# Patient Record
Sex: Male | Born: 1955 | Race: Black or African American | Hispanic: No | Marital: Married | Smoking: Never smoker
Health system: Southern US, Community
[De-identification: ages and names within clinical notes are randomized; demographics above are authoritative.]

## PROBLEM LIST (undated history)

## (undated) DIAGNOSIS — I10 Essential (primary) hypertension: Secondary | ICD-10-CM

## (undated) DIAGNOSIS — R002 Palpitations: Secondary | ICD-10-CM

## (undated) DIAGNOSIS — D1803 Hemangioma of intra-abdominal structures: Secondary | ICD-10-CM

## (undated) DIAGNOSIS — L309 Dermatitis, unspecified: Secondary | ICD-10-CM

## (undated) DIAGNOSIS — B1920 Unspecified viral hepatitis C without hepatic coma: Secondary | ICD-10-CM

## (undated) DIAGNOSIS — K5790 Diverticulosis of intestine, part unspecified, without perforation or abscess without bleeding: Secondary | ICD-10-CM

## (undated) DIAGNOSIS — N400 Enlarged prostate without lower urinary tract symptoms: Secondary | ICD-10-CM

## (undated) DIAGNOSIS — F419 Anxiety disorder, unspecified: Secondary | ICD-10-CM

## (undated) DIAGNOSIS — C61 Malignant neoplasm of prostate: Secondary | ICD-10-CM

## (undated) HISTORY — PX: ROTATOR CUFF REPAIR: SHX139

## (undated) HISTORY — DX: Unspecified viral hepatitis C without hepatic coma: B19.20

## (undated) HISTORY — DX: Diverticulosis of intestine, part unspecified, without perforation or abscess without bleeding: K57.90

## (undated) HISTORY — DX: Palpitations: R00.2

## (undated) HISTORY — DX: Benign prostatic hyperplasia without lower urinary tract symptoms: N40.0

## (undated) HISTORY — PX: CARPAL TUNNEL RELEASE: SHX101

## (undated) HISTORY — PX: PROSTATE BIOPSY: SHX241

## (undated) HISTORY — DX: Hemangioma of intra-abdominal structures: D18.03

## (undated) HISTORY — DX: Dermatitis, unspecified: L30.9

---

## 2000-07-04 ENCOUNTER — Emergency Department (HOSPITAL_COMMUNITY): Admission: EM | Admit: 2000-07-04 | Discharge: 2000-07-04 | Payer: Self-pay | Admitting: Emergency Medicine

## 2000-07-25 ENCOUNTER — Ambulatory Visit (HOSPITAL_COMMUNITY): Admission: RE | Admit: 2000-07-25 | Discharge: 2000-07-25 | Payer: Self-pay | Admitting: *Deleted

## 2000-07-29 ENCOUNTER — Encounter: Admission: RE | Admit: 2000-07-29 | Discharge: 2000-10-27 | Payer: Self-pay | Admitting: Orthopedic Surgery

## 2000-08-02 ENCOUNTER — Encounter: Admission: RE | Admit: 2000-08-02 | Discharge: 2000-08-02 | Payer: Self-pay | Admitting: Orthopedic Surgery

## 2000-08-02 ENCOUNTER — Encounter: Payer: Self-pay | Admitting: Orthopedic Surgery

## 2000-08-16 ENCOUNTER — Encounter: Payer: Self-pay | Admitting: Orthopedic Surgery

## 2000-08-16 ENCOUNTER — Encounter: Admission: RE | Admit: 2000-08-16 | Discharge: 2000-08-16 | Payer: Self-pay | Admitting: Orthopedic Surgery

## 2000-08-30 ENCOUNTER — Encounter: Payer: Self-pay | Admitting: Orthopedic Surgery

## 2000-08-30 ENCOUNTER — Encounter: Admission: RE | Admit: 2000-08-30 | Discharge: 2000-08-30 | Payer: Self-pay | Admitting: Orthopedic Surgery

## 2001-02-14 ENCOUNTER — Ambulatory Visit (HOSPITAL_BASED_OUTPATIENT_CLINIC_OR_DEPARTMENT_OTHER): Admission: RE | Admit: 2001-02-14 | Discharge: 2001-02-14 | Payer: Self-pay | Admitting: Surgery

## 2005-04-26 ENCOUNTER — Ambulatory Visit (HOSPITAL_BASED_OUTPATIENT_CLINIC_OR_DEPARTMENT_OTHER): Admission: RE | Admit: 2005-04-26 | Discharge: 2005-04-27 | Payer: Self-pay | Admitting: Orthopedic Surgery

## 2009-05-24 ENCOUNTER — Encounter: Admission: RE | Admit: 2009-05-24 | Discharge: 2009-05-24 | Payer: Self-pay | Admitting: Family Medicine

## 2010-07-14 ENCOUNTER — Other Ambulatory Visit: Payer: Self-pay | Admitting: Family Medicine

## 2010-07-14 DIAGNOSIS — R42 Dizziness and giddiness: Secondary | ICD-10-CM

## 2010-07-18 ENCOUNTER — Ambulatory Visit
Admission: RE | Admit: 2010-07-18 | Discharge: 2010-07-18 | Disposition: A | Discharge status: Home / Self Care | Payer: BC Managed Care – PPO | Source: Ambulatory Visit | Attending: Family Medicine | Admitting: Family Medicine

## 2010-07-18 DIAGNOSIS — R42 Dizziness and giddiness: Secondary | ICD-10-CM

## 2010-07-18 MED ORDER — IOHEXOL 300 MG/ML  SOLN
75.0000 mL | Freq: Once | INTRAMUSCULAR | Status: AC | PRN
  Administered 2010-07-18: 75 mL via INTRAVENOUS

## 2010-09-08 NOTE — Op Note (Signed)
[place]  Patient:    Melvin Fisher, MCCAMISH Visit Number: [id number] MRN: [id number]          Service Type: DSU Location: [facility] Attending Physician:  Shelly Rubenstein Dictated by:   Abigail Miyamoto, M.D. Proc. Date: 02/14/01 Admit Date:  02/14/2001 Discharge Date: 02/14/2001                             Operative Report  PREOPERATIVE DIAGNOSIS:  Left groin mass.  POSTOPERATIVE DIAGNOSIS:  Left groin lipoma.  PROCEDURE:  Excision of left groin mass.  SURGEON:  Abigail Miyamoto, M.D.  ANESTHESIA:  Lidocaine, 1%, and monitored anesthesia care.  ESTIMATED BLOOD LOSS:  Minimal.  FINDINGS:  The patient was found to have a 3-cm mass in the left groin below the external oblique muscle.  It was consistent with a small lipoma.  PROCEDURE IN DETAIL:  The patient was brought to the operating room and identified as [name].  He was placed supine on the operating table and anesthesia was induced.  His left groin was then prepped and draped in the usual sterile fashion.  The skin overlying the frontal palpable mass was anesthetized with 1% lidocaine.  A small incision was then made over the mass in the groin.  The incision was carried down through the Scarpa fascia with the electrocautery.  The mass was then found to be deep to the external oblique muscle which was then opened with a scalpel and further with Metzenbaum scissors.  After doing this, the mass elevated up out of the wound and was consistent with a lipoma.  It was excised circumferentially with the electrocautery.  The lipoma was approximately 3 cm in size.  The ______ oblique fascia was then closed with a running 2-0 Vicryl suture.  The Scarpa fascia was then closed with interrupted 3-0 Vicryl sutures.  The skin was closed with a running 4-0 Monocryl.  Steri-Strips, gauze, and tape were then applied.  The patient tolerated the procedure well.  All sponge, needle,  and instrument counts were correct at the end of the procedure.  The patient was then taken in stable condition from the operating room to the recovery room. Dictated by:   Abigail Miyamoto, M.D. Attending Physician:  Shelly Rubenstein DD:  02/14/01 TD:  02/16/01 Job: [id number]

## 2010-09-08 NOTE — Op Note (Signed)
NAME:  Melvin Fisher, Melvin Fisher NO.:  [id number]   MEDICAL RECORD NO.:  [id number]          PATIENT TYPE:  AMB   LOCATION:  DSC                          FACILITY:  [facility]   PHYSICIAN:  Loreta Ave, M.D. DATE OF BIRTH:  1955-04-27   DATE OF PROCEDURE:  04/26/2005  DATE OF DISCHARGE:                                 OPERATIVE REPORT   PREOPERATIVE DIAGNOSIS:  Left shoulder impingement and rotator cuff tear.   POSTOPERATIVE DIAGNOSIS:  Left shoulder anterior labrum tear, partial  tearing of the long head biceps tendon.  Completed retracted rotator cuff  tear.  Subacromial impingement.  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   PROCEDURE:  Left shoulder exam under anesthesia, arthroscopy, debridement  labrum, biceps and rotator cuff.  Bursectomy acromioplasty.  Excision of  distal clavicle.  Open repair of rotator cuff tear, mini incision, FiberWire  suture and Concept repair system.   SURGEON:  Loreta Ave, M.D.   ASSISTANT:  Tonye Becket, PA   ANESTHESIA:  General.   BLOOD LOSS:  Minimal.   SPECIMEN:  None.   CULTURES:  None.   COMPLICATIONS:  None.   DRESSING:  Soft compressive with shoulder immobilizer.   The patient was brought to the operating room and after adequate anesthesia  had been obtained, the left shoulder examined.  Full motion and good  stability.  Placed in a beach chair position on the shoulder positioner and  prepped and draped in the usual sterile fashion.  Three portals, anterior,  posterior and lateral.  Shoulder entered with blunt obturator and distended.  Arthroscope introduced and the shoulder inspected.  Complex tearing of the  anterior superior labrum debrided.  Little mobility of the biceps but the  anchor still intact.  Partial-thickness tearing of the long head biceps  tendon.  Interarticular portion debrided.  The majority of the tendon still  intact.  Articular cartilage, capsule and  ligamentous structures were all  otherwise intact.  Complete avulsion of the supraspinatus tendon, retracted  a centimeter.  Still a bit mobile.  Cannula redirected subacromially.  Confirmed repairable cuff.  Type III acromion.  Bursa resected.  Acromioplasty to a type I acromion, releasing CA ligament.  Distal clavicle  grade 4 changes.  Lateral centimeter and periarticular spurs resected.  Adequacy of decompression and clavicle excision confirmed via multiple  portals.  Instruments and fluid were removed.   Deltoid splitting incision through lateral portal.  Subacromial space  exposed through a deltoid split.  Adequacy of decompression confirmed.  Cuff  was mobilized.  Bony trough created at the tuberosity.  I then wove three #2  FiberWire sutures into the cuff.  These were then brought through a series  of drill holes in the tuberosity and out through the lateral shaft.  The arm  was abducted, cuff firmly repaired, tying sutures over bony bridge.  At  completion, I had a nice, firm, watertight closure without undue tension  even through full motion.   Wound irrigated.  Deltoid closed with Vicryl.  Skin and subcutaneous tissue  with Vicryl.  Portals closed with nylon.  Sterile compressive dressing  applied.  Shoulder immobilizer applied.  Anesthesia reversed.  Brought to  recovery room.  Tolerated surgery well, no complications.      Loreta Ave, M.D.  Electronically Signed     DFM/MEDQ  D:  04/26/2005  T:  04/26/2005  Job:  [id number]

## 2012-06-28 ENCOUNTER — Emergency Department (HOSPITAL_COMMUNITY): Payer: No Typology Code available for payment source

## 2012-06-28 ENCOUNTER — Encounter (HOSPITAL_COMMUNITY): Payer: Self-pay

## 2012-06-28 ENCOUNTER — Emergency Department (HOSPITAL_COMMUNITY)
Admission: EM | Admit: 2012-06-28 | Discharge: 2012-06-28 | Disposition: A | Discharge status: Home / Self Care | Payer: No Typology Code available for payment source | Attending: Emergency Medicine | Admitting: Emergency Medicine

## 2012-06-28 DIAGNOSIS — Y92410 Unspecified street and highway as the place of occurrence of the external cause: Secondary | ICD-10-CM | POA: Insufficient documentation

## 2012-06-28 DIAGNOSIS — M545 Low back pain, unspecified: Secondary | ICD-10-CM

## 2012-06-28 DIAGNOSIS — S39012A Strain of muscle, fascia and tendon of lower back, initial encounter: Secondary | ICD-10-CM

## 2012-06-28 DIAGNOSIS — Z79899 Other long term (current) drug therapy: Secondary | ICD-10-CM | POA: Insufficient documentation

## 2012-06-28 DIAGNOSIS — Y9389 Activity, other specified: Secondary | ICD-10-CM | POA: Insufficient documentation

## 2012-06-28 DIAGNOSIS — M5380 Other specified dorsopathies, site unspecified: Secondary | ICD-10-CM | POA: Insufficient documentation

## 2012-06-28 DIAGNOSIS — M62830 Muscle spasm of back: Secondary | ICD-10-CM

## 2012-06-28 DIAGNOSIS — S139XXA Sprain of joints and ligaments of unspecified parts of neck, initial encounter: Secondary | ICD-10-CM | POA: Insufficient documentation

## 2012-06-28 DIAGNOSIS — S161XXA Strain of muscle, fascia and tendon at neck level, initial encounter: Secondary | ICD-10-CM

## 2012-06-28 DIAGNOSIS — I10 Essential (primary) hypertension: Secondary | ICD-10-CM | POA: Insufficient documentation

## 2012-06-28 DIAGNOSIS — S335XXA Sprain of ligaments of lumbar spine, initial encounter: Secondary | ICD-10-CM | POA: Insufficient documentation

## 2012-06-28 HISTORY — DX: Essential (primary) hypertension: I10

## 2012-06-28 MED ORDER — METHOCARBAMOL 750 MG PO TABS: 750.0000 mg | ORAL_TABLET | Freq: Four times a day (QID) | ORAL | Status: DC | PRN

## 2012-06-28 MED ORDER — HYDROCODONE-ACETAMINOPHEN 5-325 MG PO TABS: 1.0000 | ORAL_TABLET | Freq: Four times a day (QID) | ORAL | Status: DC | PRN

## 2012-06-28 MED ORDER — NAPROXEN 500 MG PO TABS: 500.0000 mg | ORAL_TABLET | Freq: Two times a day (BID) | ORAL | Status: DC | PRN
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
## 2012-06-28 NOTE — ED Provider Notes (Signed)
Medical screening examination/treatment/procedure(s) were performed by non-physician practitioner and as supervising physician I was immediately available for consultation/collaboration.   Keriann Rankin, MD 06/28/12 2337 
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
## 2012-07-29 ENCOUNTER — Other Ambulatory Visit: Payer: Self-pay | Admitting: Gastroenterology

## 2012-07-29 DIAGNOSIS — R109 Unspecified abdominal pain: Secondary | ICD-10-CM

## 2012-07-31 ENCOUNTER — Ambulatory Visit
Admission: RE | Admit: 2012-07-31 | Discharge: 2012-07-31 | Disposition: A | Discharge status: Home / Self Care | Payer: BC Managed Care – PPO | Source: Ambulatory Visit | Attending: Gastroenterology | Admitting: Gastroenterology

## 2012-07-31 DIAGNOSIS — R109 Unspecified abdominal pain: Secondary | ICD-10-CM

## 2012-07-31 MED ORDER — IOHEXOL 300 MG/ML  SOLN
125.0000 mL | Freq: Once | INTRAMUSCULAR | Status: AC | PRN
  Administered 2012-07-31: 125 mL via INTRAVENOUS

## 2013-08-15 IMAGING — CT CT ABD-PELV W/ CM
3 of 5 series · 13 of 36 positions shown, 19 images · IV contrast (READICAT/WATER & [ID] OMNI 300)
Comparison: CT abdomen 05/24/2009

CLINICAL DATA: Left side abdominal pain for 4-5 months.

CT ABDOMEN AND PELVIS WITH CONTRAST
TECHNIQUE: Multidetector CT imaging of the abdomen and pelvis was
performed following the standard protocol during bolus
administration of intravenous contrast.
Contrast: 125mL OMNIPAQUE IOHEXOL 300 MG/ML  SOLN

[Series 3: abd/pelvis with · axial · 0.82mm/px · z∈[-424,-64]mm · 7 of 97 slices shown, 12 images]
[im 13/97  soft-tissue]
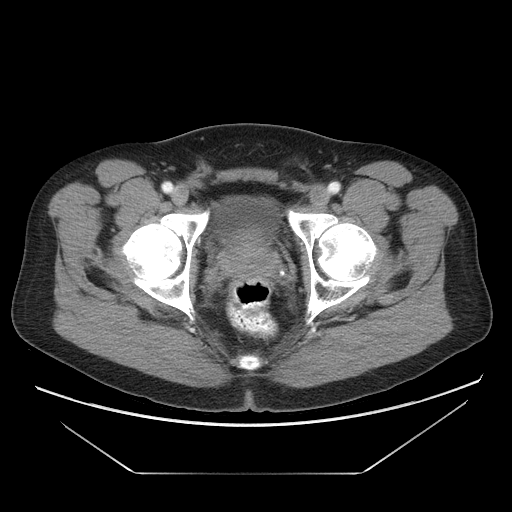
[im 13/97  bone]
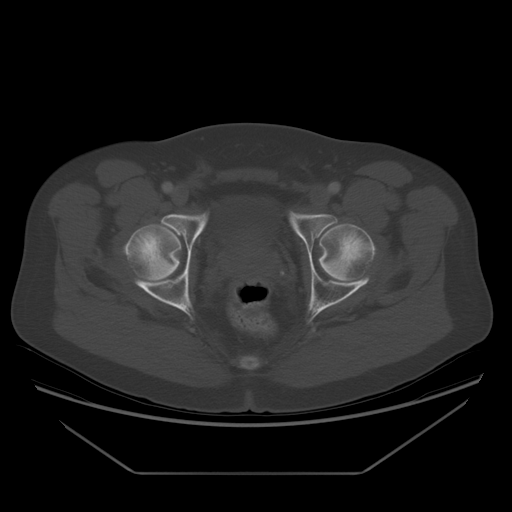
[im 25/97  soft-tissue]
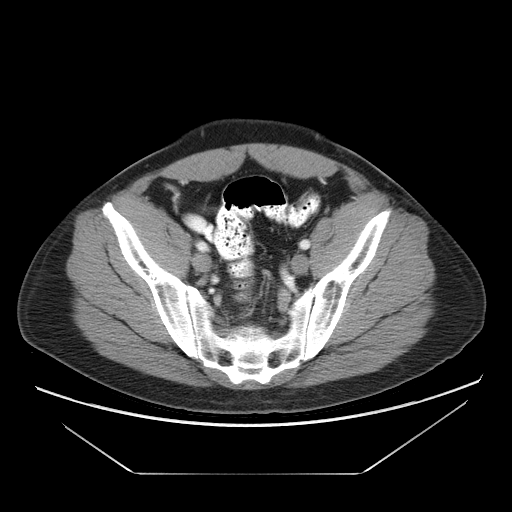
[im 37/97  soft-tissue]
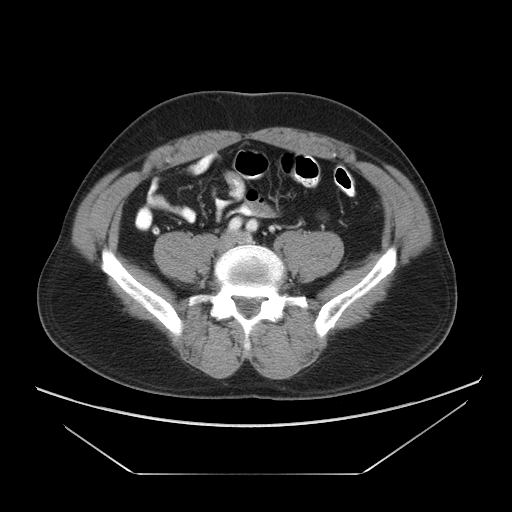
[im 49/97  soft-tissue]
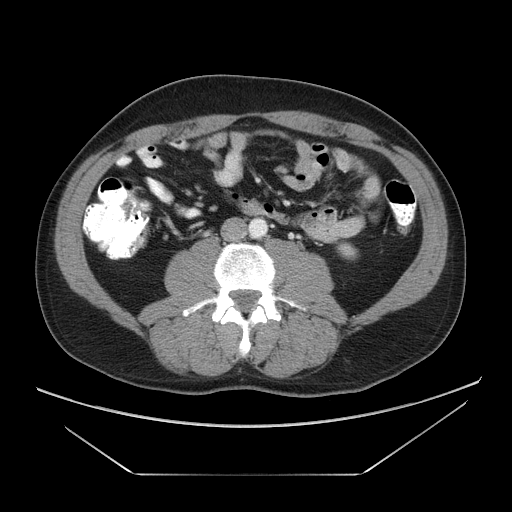
[im 49/97  lung]
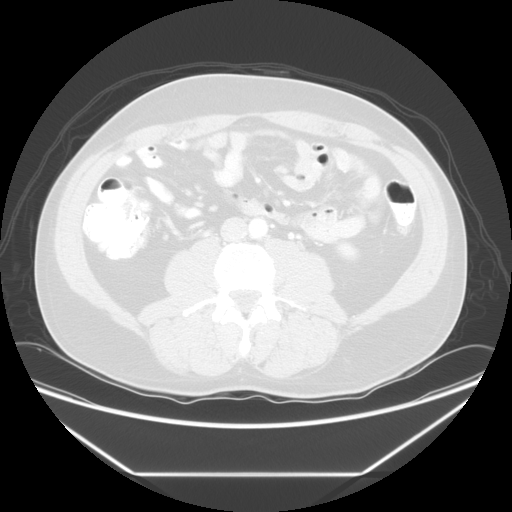
[im 61/97  soft-tissue]
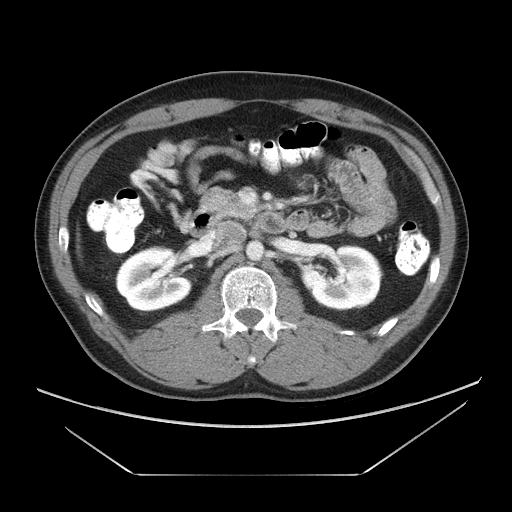
[im 61/97  lung]
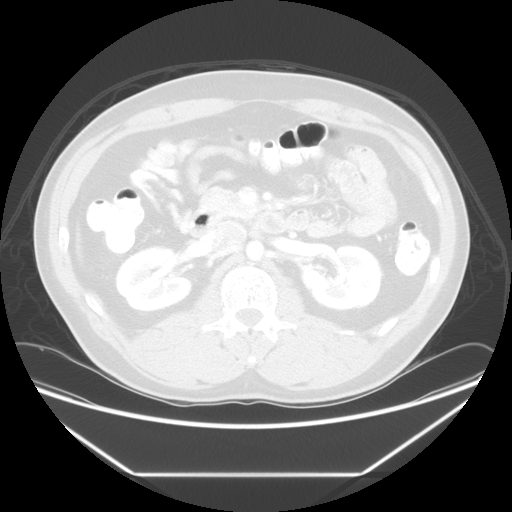
[im 73/97  soft-tissue]
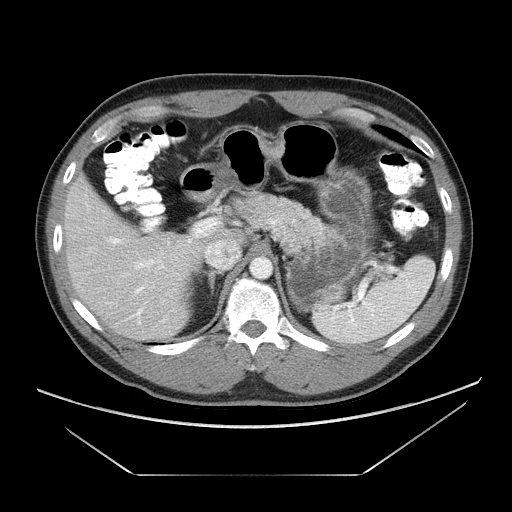
[im 73/97  lung]
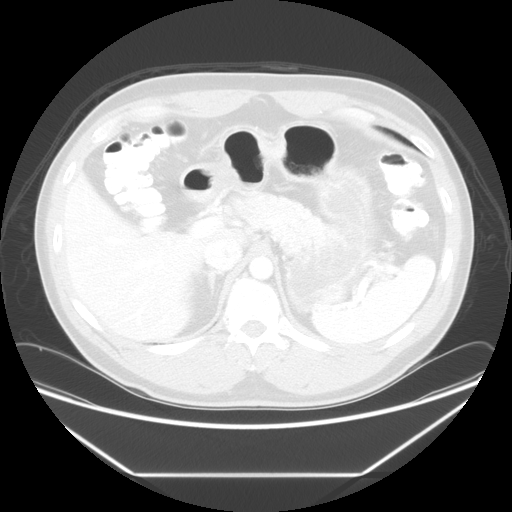
[im 85/97  soft-tissue]
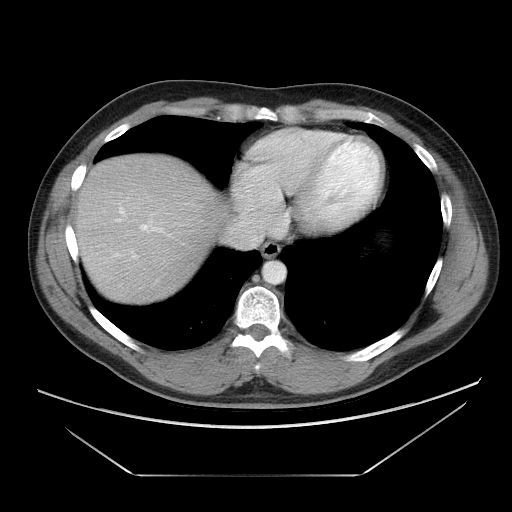
[im 85/97  lung]
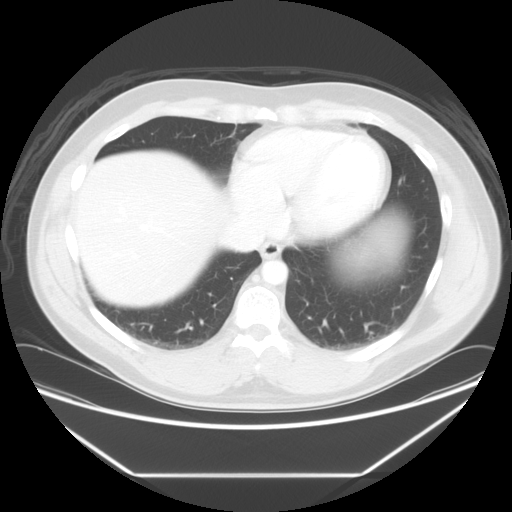

[Series 601: coronal body · coronal · 0.99mm/px · 1 of 116 slices shown, 2 images]
[im 39/116  soft-tissue]
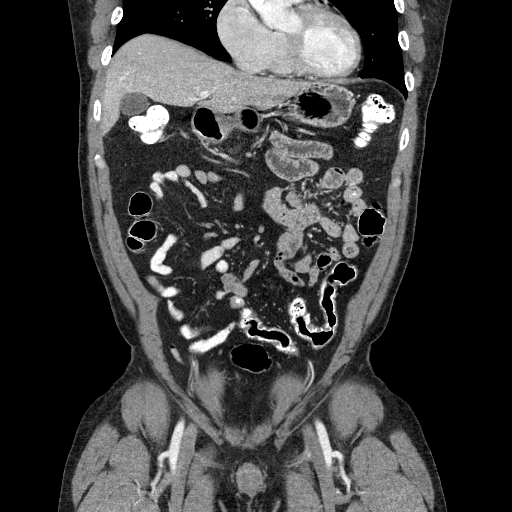
[im 39/116  bone]
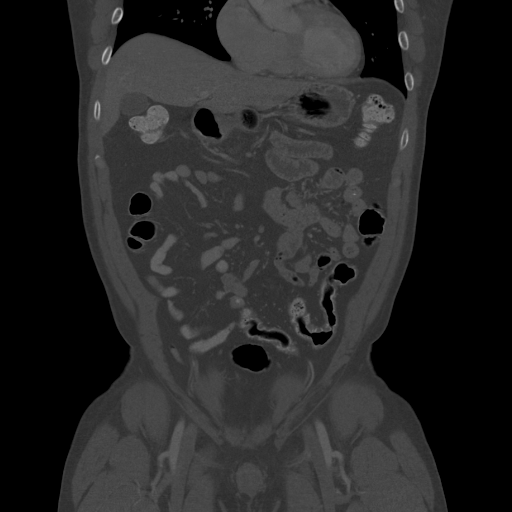

[Series 602: sagittal body · sagittal · 0.99mm/px · 5 of 167 slices shown]
[im 11/167  soft-tissue]
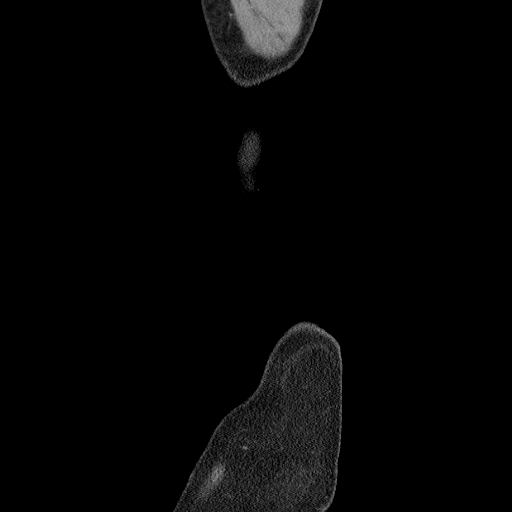
[im 32/167  soft-tissue]
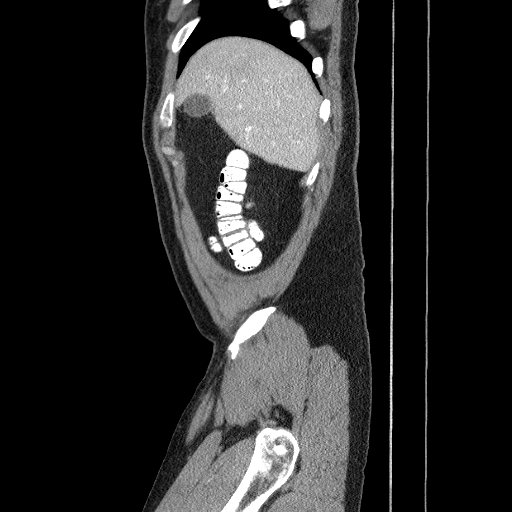
[im 52/167  soft-tissue]
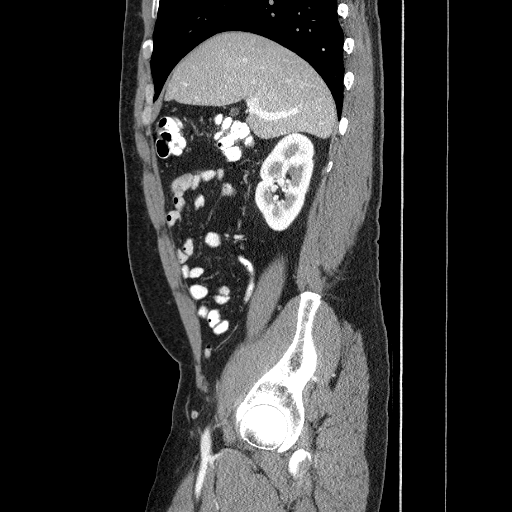
[im 73/167  soft-tissue]
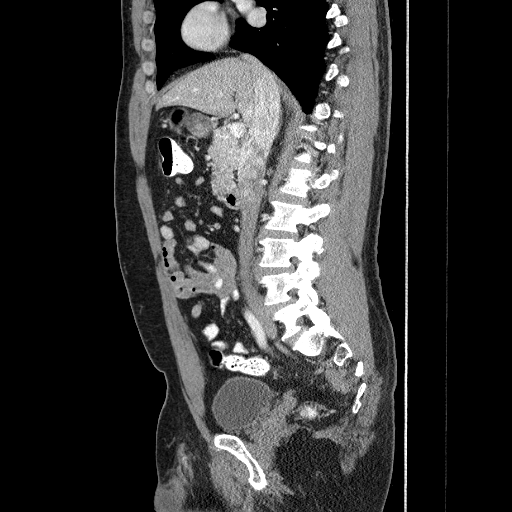
[im 94/167  soft-tissue]
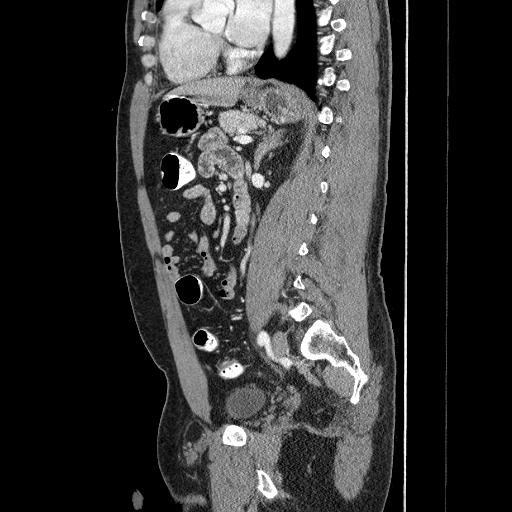

[13 of 36 positions shown; findings below may reference images not displayed]

FINDINGS: Lung bases are clear.  No pericardial fluid.

There is an 11 mm lesion in the posterior aspect of the right
hepatic lobe (image [DATE]) which is unchanged from prior and likely
represents a small hemangioma.  No new hepatic lesion.  The
gallbladder, pancreas, spleen, adrenal glands, and kidneys are
normal.

The stomach, small bowel, and colon are normal. No diverticulosis
Appendix is normal.

Abdominal aorta normal caliber.  No retroperitoneal periportal
lymphadenopathy.

No free fluid the pelvis.  Prostate gland bladder normal.  No
pelvic lymphadenopathy.  Small fat filled filled left inguinal
hernia.

No acute osseous abnormality.
IMPRESSION: 1..  No acute abdominal or pelvic findings.

2.  Stable lesion in the right hepatic lobe likely represents
benign hemangioma.

3.  Small left inguinal hernia.

## 2014-09-24 ENCOUNTER — Other Ambulatory Visit: Payer: Self-pay | Admitting: Gastroenterology

## 2014-09-24 DIAGNOSIS — R109 Unspecified abdominal pain: Secondary | ICD-10-CM

## 2014-09-30 ENCOUNTER — Ambulatory Visit
Admission: RE | Admit: 2014-09-30 | Discharge: 2014-09-30 | Disposition: A | Discharge status: Home / Self Care | Payer: BLUE CROSS/BLUE SHIELD | Source: Ambulatory Visit | Attending: Gastroenterology | Admitting: Gastroenterology

## 2014-09-30 DIAGNOSIS — R109 Unspecified abdominal pain: Secondary | ICD-10-CM

## 2014-09-30 MED ORDER — IOPAMIDOL (ISOVUE-300) INJECTION 61%
100.0000 mL | Freq: Once | INTRAVENOUS | Status: AC | PRN
  Administered 2014-09-30: 100 mL via INTRAVENOUS

## 2014-10-11 ENCOUNTER — Encounter (HOSPITAL_COMMUNITY): Payer: Self-pay

## 2014-10-11 ENCOUNTER — Emergency Department (HOSPITAL_COMMUNITY): Payer: BLUE CROSS/BLUE SHIELD

## 2014-10-11 ENCOUNTER — Emergency Department (HOSPITAL_COMMUNITY)
Admission: EM | Admit: 2014-10-11 | Discharge: 2014-10-11 | Disposition: A | Discharge status: Home / Self Care | Payer: BLUE CROSS/BLUE SHIELD | Attending: Emergency Medicine | Admitting: Emergency Medicine

## 2014-10-11 DIAGNOSIS — Z872 Personal history of diseases of the skin and subcutaneous tissue: Secondary | ICD-10-CM | POA: Insufficient documentation

## 2014-10-11 DIAGNOSIS — R42 Dizziness and giddiness: Secondary | ICD-10-CM | POA: Diagnosis present

## 2014-10-11 DIAGNOSIS — Z87448 Personal history of other diseases of urinary system: Secondary | ICD-10-CM | POA: Insufficient documentation

## 2014-10-11 DIAGNOSIS — R531 Weakness: Secondary | ICD-10-CM | POA: Diagnosis not present

## 2014-10-11 DIAGNOSIS — Z8619 Personal history of other infectious and parasitic diseases: Secondary | ICD-10-CM | POA: Diagnosis not present

## 2014-10-11 DIAGNOSIS — Z79899 Other long term (current) drug therapy: Secondary | ICD-10-CM | POA: Insufficient documentation

## 2014-10-11 DIAGNOSIS — I10 Essential (primary) hypertension: Secondary | ICD-10-CM | POA: Diagnosis not present

## 2014-10-11 DIAGNOSIS — R202 Paresthesia of skin: Secondary | ICD-10-CM | POA: Insufficient documentation

## 2014-10-11 LAB — CBC
HCT: 44 % (ref 39.0–52.0)
HEMOGLOBIN: 15.3 g/dL (ref 13.0–17.0)
MCH: 29.5 pg (ref 26.0–34.0)
MCHC: 34.8 g/dL (ref 30.0–36.0)
MCV: 84.8 fL (ref 78.0–100.0)
Platelets: 156 10*3/uL (ref 150–400)
RBC: 5.19 MIL/uL (ref 4.22–5.81)
RDW: 12.2 % (ref 11.5–15.5)
WBC: 6.6 10*3/uL (ref 4.0–10.5)

## 2014-10-11 LAB — BASIC METABOLIC PANEL
Anion gap: 6 (ref 5–15)
BUN: 14 mg/dL (ref 6–20)
CHLORIDE: 109 mmol/L (ref 101–111)
CO2: 23 mmol/L (ref 22–32)
Calcium: 9.4 mg/dL (ref 8.9–10.3)
Creatinine, Ser: 1.17 mg/dL (ref 0.61–1.24)
GFR calc Af Amer: >60 mL/min (ref >60)
GFR calc non Af Amer: >60 mL/min (ref >60)
GLUCOSE: 117 mg/dL — AB (ref 65–99)
POTASSIUM: 4.3 mmol/L (ref 3.5–5.1)
SODIUM: 138 mmol/L (ref 135–145)

## 2014-10-11 LAB — TROPONIN I: Troponin I: <0.03 ng/mL (ref <0.031)

## 2014-10-11 MED ORDER — LORAZEPAM 1 MG PO TABS
1.0000 mg | ORAL_TABLET | Freq: Once | ORAL | Status: AC
  Administered 2014-10-11: 1 mg via ORAL
  Filled 2014-10-11: qty 1

## 2014-10-11 NOTE — Discharge Instructions (Signed)
Dizziness °Dizziness is a common problem. It is a feeling of unsteadiness or light-headedness. You may feel like you are about to faint. Dizziness can lead to injury if you stumble or fall. A person of any age group can suffer from dizziness, but dizziness is more common in older adults. °CAUSES  °Dizziness can be caused by many different things, including: °· Middle ear problems. °· Standing for too long. °· Infections. °· An allergic reaction. °· Aging. °· An emotional response to something, such as the sight of blood. °· Side effects of medicines. °· Tiredness. °· Problems with circulation or blood pressure. °· Excessive use of alcohol or medicines, or illegal drug use. °· Breathing too fast (hyperventilation). °· An irregular heart rhythm (arrhythmia). °· A low red blood cell count (anemia). °· Pregnancy. °· Vomiting, diarrhea, fever, or other illnesses that cause body fluid loss (dehydration). °· Diseases or conditions such as Parkinson's disease, high blood pressure (hypertension), diabetes, and thyroid problems. °· Exposure to extreme heat. °DIAGNOSIS  °Your health care provider will ask about your symptoms, perform a physical exam, and perform an electrocardiogram (ECG) to record the electrical activity of your heart. Your health care provider may also perform other heart or blood tests to determine the cause of your dizziness. These may include: °· Transthoracic echocardiogram (TTE). During echocardiography, sound waves are used to evaluate how blood flows through your heart. °· Transesophageal echocardiogram (TEE). °· Cardiac monitoring. This allows your health care provider to monitor your heart rate and rhythm in real time. °· Holter monitor. This is a portable device that records your heartbeat and can help diagnose heart arrhythmias. It allows your health care provider to track your heart activity for several days if needed. °· Stress tests by exercise or by giving medicine that makes the heart beat  faster. °TREATMENT  °Treatment of dizziness depends on the cause of your symptoms and can vary greatly. °HOME CARE INSTRUCTIONS  °· Drink enough fluids to keep your urine clear or pale yellow. This is especially important in very hot weather. In older adults, it is also important in cold weather. °· Take your medicine exactly as directed if your dizziness is caused by medicines. When taking blood pressure medicines, it is especially important to get up slowly. °· Rise slowly from chairs and steady yourself until you feel okay. °· In the morning, first sit up on the side of the bed. When you feel okay, stand slowly while holding onto something until you know your balance is fine. °· Move your legs often if you need to stand in one place for a long time. Tighten and relax your muscles in your legs while standing. °· Have someone stay with you for 1-2 days if dizziness continues to be a problem. Do this until you feel you are well enough to stay alone. Have the person call your health care provider if he or she notices changes in you that are concerning. °· Do not drive or use heavy machinery if you feel dizzy. °· Do not drink alcohol. °SEEK IMMEDIATE MEDICAL CARE IF:  °· Your dizziness or light-headedness gets worse. °· You feel nauseous or vomit. °· You have problems talking, walking, or using your arms, hands, or legs. °· You feel weak. °· You are not thinking clearly or you have trouble forming sentences. It may take a friend or family member to notice this. °· You have chest pain, abdominal pain, shortness of breath, or sweating. °· Your vision changes. °· You notice   any bleeding.  You have side effects from medicine that seems to be getting worse rather than better. MAKE SURE YOU:   Understand these instructions.  Will watch your condition.  Will get help right away if you are not doing well or get worse. Document Released: 10/03/2000 Document Revised: 04/14/2013 Document Reviewed: 10/27/2010 [facility]  Patient Information 2015 [place], [place]. This information is not intended to replace advice given to you by your health care provider. Make sure you discuss any questions you have with your health care provider.  Paresthesia Paresthesia is an abnormal burning or prickling sensation. This sensation is generally felt in the hands, arms, legs, or feet. However, it may occur in any part of the body. It is usually not painful. The feeling may be described as:  Tingling or numbness.  "Pins and needles."  Skin crawling.  Buzzing.  Limbs "falling asleep."  Itching. Most people experience temporary (transient) paresthesia at some time in their lives. CAUSES  Paresthesia may occur when you breathe too quickly (hyperventilation). It can also occur without any apparent cause. Commonly, paresthesia occurs when pressure is placed on a nerve. The feeling quickly goes away once the pressure is removed. For some people, however, paresthesia is a long-lasting (chronic) condition caused by an underlying disorder. The underlying disorder may be:  A traumatic, direct injury to nerves. Examples include a:  Broken (fractured) neck.  Fractured skull.  A disorder affecting the brain and spinal cord (central nervous system). Examples include:  Transverse myelitis.  Encephalitis.  Transient ischemic attack.  Multiple sclerosis.  Stroke.  Tumor or blood vessel problems, such as an arteriovenous malformation pressing against the brain or spinal cord.  A condition that damages the peripheral nerves (peripheral neuropathy). Peripheral nerves are not part of the brain and spinal cord. These conditions include:  Diabetes.  Peripheral vascular disease.  Nerve entrapment syndromes, such as carpal tunnel syndrome.  Shingles.  Hypothyroidism.  Vitamin B12 deficiencies.  Alcoholism.  Heavy metal poisoning (lead, arsenic).  Rheumatoid arthritis.  Systemic lupus erythematosus. DIAGNOSIS  Your  caregiver will attempt to find the underlying cause of your paresthesia. Your caregiver may:  Take your medical history.  Perform a physical exam.  Order various lab tests.  Order imaging tests. TREATMENT  Treatment for paresthesia depends on the underlying cause. HOME CARE INSTRUCTIONS  Avoid drinking alcohol.  You may consider massage or acupuncture to help relieve your symptoms.  Keep all follow-up appointments as directed by your caregiver. SEEK IMMEDIATE MEDICAL CARE IF:   You feel weak.  You have trouble walking or moving.  You have problems with speech or vision.  You feel confused.  You cannot control your bladder or bowel movements.  You feel numbness after an injury.  You faint.  Your burning or prickling feeling gets worse when walking.  You have pain, cramps, or dizziness.  You develop a rash. MAKE SURE YOU:  Understand these instructions.  Will watch your condition.  Will get help right away if you are not doing well or get worse. Document Released: 03/30/2002 Document Revised: 07/02/2011 Document Reviewed: 12/29/2010 [facility] Patient Information 2015 [place], [place]. This information is not intended to replace advice given to you by your health care provider. Make sure you discuss any questions you have with your health care provider.

## 2014-10-11 NOTE — ED Notes (Signed)
Pt c/o intermittent dizziness/lightheadedness x 2 months and L arm tingling starting this morning.  Denies pain.  Pt report lightheadedness when standing quickly and sometime w/ driving.  Denies weakness.  Facial symmetry and clear speech noted.

## 2014-10-11 NOTE — ED Provider Notes (Signed)
CSN: [id number]     Arrival date & time 10/11/14  1207 History   First MD Initiated Contact with Patient 10/11/14 1535     Chief Complaint  Patient presents with  . Dizziness  . Tingling     (Consider location/radiation/quality/duration/timing/severity/associated sxs/prior Treatment) HPI Comments: Patient presents to the emergency department for evaluation of dizziness, lightheadedness and left-sided numbness and tingling. Patient reports that he has been having intermittent episodes of dizziness for 2 months. He reports that this often occurs when he bends over and then stands up. Symptoms do not last long and then do resolve. Today, however, he has been noticing tingling, numbness and possible weakness of the left arm. This is a new symptom for him.  Patient is a 59 y.o. male presenting with dizziness.  Dizziness Associated symptoms: weakness     Past Medical History  Diagnosis Date  . Hypertension   . Eczema   . BPH (benign prostatic hyperplasia)   . Diverticulosis   . Hemangioma of liver   . Palpitations   . Hepatitis C    Past Surgical History  Procedure Laterality Date  . Carpal tunnel release    . Rotator cuff repair     Family History  Problem Relation Age of Onset  . Hypertension Mother   . Cancer - Lung Father   . Hypertension Sister   . Diabetes Mellitus II Maternal Grandmother   . Hypertension Sister    History  Substance Use Topics  . Smoking status: Never Smoker   . Smokeless tobacco: Not on file  . Alcohol Use: Yes     Comment: occasional    Review of Systems  Neurological: Positive for dizziness, weakness and numbness.  All other systems reviewed and are negative.     Allergies  Review of patient's allergies indicates no known allergies.  Home Medications   Prior to Admission medications   Medication Sig Start Date End Date Taking? Authorizing Provider  lisinopril (PRINIVIL,ZESTRIL) 20 MG tablet Take 10 mg by mouth daily.    Yes  Historical Provider, MD  naproxen sodium (ANAPROX) 220 MG tablet Take 440 mg by mouth every 12 (twelve) hours as needed (pain).   Yes Historical Provider, MD  simvastatin (ZOCOR) 20 MG tablet Take 20 mg by mouth every evening.   Yes Historical Provider, MD  triamcinolone lotion (KENALOG) 0.1 % Apply 1 application topically 3 (three) times daily as needed (skin irritation).    Yes Historical Provider, MD   BP 132/85 mmHg  Pulse 65  Temp(Src) 98.3 F (36.8 C) (Oral)  Resp 16  SpO2 100% Physical Exam  Constitutional: He is oriented to person, place, and time. He appears well-developed and well-nourished. No distress.  HENT:  Head: Normocephalic and atraumatic.  Right Ear: Hearing normal.  Left Ear: Hearing normal.  Nose: Nose normal.  Mouth/Throat: Oropharynx is clear and moist and mucous membranes are normal.  Eyes: Conjunctivae and EOM are normal. Pupils are equal, round, and reactive to light.  Neck: Normal range of motion. Neck supple.  Cardiovascular: Regular rhythm, S1 normal and S2 normal.  Exam reveals no gallop and no friction rub.   No murmur heard. Pulmonary/Chest: Effort normal and breath sounds normal. No respiratory distress. He exhibits no tenderness.  Abdominal: Soft. Normal appearance and bowel sounds are normal. There is no hepatosplenomegaly. There is no tenderness. There is no rebound, no guarding, no tenderness at McBurney's point and negative Murphy's sign. No hernia.  Musculoskeletal: Normal range of motion.  Neurological:  He is alert and oriented to person, place, and time. He has normal strength. No cranial nerve deficit or sensory deficit. Coordination normal. GCS eye subscore is 4. GCS verbal subscore is 5. GCS motor subscore is 6.  Extraocular muscle movement: normal No visual field cut Pupils: equal and reactive both direct and consensual response is normal No nystagmus present    Sensory function is intact to light touch, pinprick Proprioception  intact  Grip strength 5/5 symmetric in upper extremities No pronator drift Normal finger to nose bilaterally  Lower extremity strength 5/5 against gravity Normal heel to shin bilaterally  Gait: normal   Skin: Skin is warm, dry and intact. No rash noted. No cyanosis.  Psychiatric: He has a normal mood and affect. His speech is normal and behavior is normal. Thought content normal.  Nursing note and vitals reviewed.   ED Course  Procedures (including critical care time) Labs Review Labs Reviewed  BASIC METABOLIC PANEL - Abnormal; Notable for the following:    Glucose, Bld 117 (*)    All other components within normal limits  CBC  TROPONIN I    Imaging Review No results found.   EKG Interpretation   Date/Time:  Monday October 11 2014 12:19:56 EDT Ventricular Rate:  83 PR Interval:  162 QRS Duration: 88 QT Interval:  373 QTC Calculation: 438 R Axis:   -23 Text Interpretation:  Sinus rhythm Borderline left axis deviation No  significant change since last tracing Confirmed by POLLINA  MD,  CHRISTOPHER [phone]) on 10/11/2014 3:40:24 PM      MDM   Final diagnoses:  None   dizziness  Paresthesia  Patient has been experiencing intermittent episodes of dizziness, per my value with bending over and then standing back up again. This is been ongoing for 2 months. He does not have any continuous dizziness. There are no focal neurologic findings on examination. Patient has begun to experience numbness and tingling of his left arm today. Examination does not reveal any obvious weakness or sensory deficit. MRI of head was performed to evaluate the dizziness as well as to rule out stroke causing his left upper extremity symptoms. MRI did not show any acute abnormality. I do not feel that this is a TIA, most likely paresthesia from a peripheral origin and does not require further workup. Patient will be discharged, follow-up with PCP in the office for further  evaluation.    Orpah Greek, MD 10/11/14 [id number]

## 2014-12-03 ENCOUNTER — Encounter (HOSPITAL_COMMUNITY): Payer: Self-pay

## 2014-12-03 ENCOUNTER — Emergency Department (HOSPITAL_COMMUNITY): Payer: BLUE CROSS/BLUE SHIELD

## 2014-12-03 ENCOUNTER — Emergency Department (HOSPITAL_COMMUNITY)
Admission: EM | Admit: 2014-12-03 | Discharge: 2014-12-03 | Disposition: A | Discharge status: Home / Self Care | Payer: BLUE CROSS/BLUE SHIELD | Attending: Emergency Medicine | Admitting: Emergency Medicine

## 2014-12-03 DIAGNOSIS — Z8719 Personal history of other diseases of the digestive system: Secondary | ICD-10-CM | POA: Diagnosis not present

## 2014-12-03 DIAGNOSIS — Z8619 Personal history of other infectious and parasitic diseases: Secondary | ICD-10-CM | POA: Diagnosis not present

## 2014-12-03 DIAGNOSIS — R1084 Generalized abdominal pain: Secondary | ICD-10-CM

## 2014-12-03 DIAGNOSIS — I10 Essential (primary) hypertension: Secondary | ICD-10-CM | POA: Insufficient documentation

## 2014-12-03 DIAGNOSIS — R531 Weakness: Secondary | ICD-10-CM | POA: Diagnosis not present

## 2014-12-03 DIAGNOSIS — R630 Anorexia: Secondary | ICD-10-CM | POA: Diagnosis not present

## 2014-12-03 DIAGNOSIS — Z8505 Personal history of malignant neoplasm of liver: Secondary | ICD-10-CM | POA: Diagnosis not present

## 2014-12-03 DIAGNOSIS — Z87448 Personal history of other diseases of urinary system: Secondary | ICD-10-CM | POA: Insufficient documentation

## 2014-12-03 DIAGNOSIS — R109 Unspecified abdominal pain: Secondary | ICD-10-CM | POA: Diagnosis present

## 2014-12-03 DIAGNOSIS — R5383 Other fatigue: Secondary | ICD-10-CM | POA: Insufficient documentation

## 2014-12-03 DIAGNOSIS — Z79899 Other long term (current) drug therapy: Secondary | ICD-10-CM | POA: Insufficient documentation

## 2014-12-03 DIAGNOSIS — Z872 Personal history of diseases of the skin and subcutaneous tissue: Secondary | ICD-10-CM | POA: Insufficient documentation

## 2014-12-03 LAB — CBC WITH DIFFERENTIAL/PLATELET
BASOS PCT: 0 % (ref 0–1)
Basophils Absolute: 0 10*3/uL (ref 0.0–0.1)
EOS ABS: 0.1 10*3/uL (ref 0.0–0.7)
EOS PCT: 1 % (ref 0–5)
HCT: 45.7 % (ref 39.0–52.0)
Hemoglobin: 15.4 g/dL (ref 13.0–17.0)
LYMPHS ABS: 1 10*3/uL (ref 0.7–4.0)
Lymphocytes Relative: 17 % (ref 12–46)
MCH: 29.6 pg (ref 26.0–34.0)
MCHC: 33.7 g/dL (ref 30.0–36.0)
MCV: 87.7 fL (ref 78.0–100.0)
Monocytes Absolute: 0.4 10*3/uL (ref 0.1–1.0)
Monocytes Relative: 7 % (ref 3–12)
NEUTROS PCT: 75 % (ref 43–77)
Neutro Abs: 4.4 10*3/uL (ref 1.7–7.7)
PLATELETS: 191 10*3/uL (ref 150–400)
RBC: 5.21 MIL/uL (ref 4.22–5.81)
RDW: 12.3 % (ref 11.5–15.5)
WBC: 5.9 10*3/uL (ref 4.0–10.5)

## 2014-12-03 LAB — URINALYSIS, ROUTINE W REFLEX MICROSCOPIC
Bilirubin Urine: NEGATIVE
Glucose, UA: NEGATIVE mg/dL
Hgb urine dipstick: NEGATIVE
Ketones, ur: NEGATIVE mg/dL
LEUKOCYTES UA: NEGATIVE
NITRITE: NEGATIVE
PH: 7.5 (ref 5.0–8.0)
Protein, ur: NEGATIVE mg/dL
SPECIFIC GRAVITY, URINE: 1.029 (ref 1.005–1.030)
UROBILINOGEN UA: 1 mg/dL (ref 0.0–1.0)

## 2014-12-03 LAB — COMPREHENSIVE METABOLIC PANEL
ALK PHOS: 57 U/L (ref 38–126)
ALT: 17 U/L (ref 17–63)
ANION GAP: 5 (ref 5–15)
AST: 23 U/L (ref 15–41)
Albumin: 4.4 g/dL (ref 3.5–5.0)
BILIRUBIN TOTAL: 0.8 mg/dL (ref 0.3–1.2)
BUN: 23 mg/dL — AB (ref 6–20)
CHLORIDE: 105 mmol/L (ref 101–111)
CO2: 30 mmol/L (ref 22–32)
CREATININE: 1.27 mg/dL — AB (ref 0.61–1.24)
Calcium: 9.8 mg/dL (ref 8.9–10.3)
GFR calc Af Amer: >60 mL/min (ref >60)
GLUCOSE: 105 mg/dL — AB (ref 65–99)
Potassium: 4 mmol/L (ref 3.5–5.1)
SODIUM: 140 mmol/L (ref 135–145)
Total Protein: 7.5 g/dL (ref 6.5–8.1)

## 2014-12-03 LAB — LIPASE, BLOOD: LIPASE: 29 U/L (ref 22–51)

## 2014-12-03 MED ORDER — DICYCLOMINE HCL 20 MG PO TABS: 20.0000 mg | ORAL_TABLET | Freq: Two times a day (BID) | ORAL | Status: DC

## 2014-12-03 NOTE — Discharge Instructions (Signed)
Continue to eat well and stay hydrated. Bentyl for abdominal spasms. Follow up with your doctor and for colonoscopy as scheduled. Return if worsening symptoms.   Abdominal Pain Many things can cause abdominal pain. Usually, abdominal pain is not caused by a disease and will improve without treatment. It can often be observed and treated at home. Your health care provider will do a physical exam and possibly order blood tests and X-rays to help determine the seriousness of your pain. However, in many cases, more time must pass before a clear cause of the pain can be found. Before that point, your health care provider may not know if you need more testing or further treatment. HOME CARE INSTRUCTIONS  Monitor your abdominal pain for any changes. The following actions may help to alleviate any discomfort you are experiencing:  Only take over-the-counter or prescription medicines as directed by your health care provider.  Do not take laxatives unless directed to do so by your health care provider.  Try a clear liquid diet (broth, tea, or water) as directed by your health care provider. Slowly move to a bland diet as tolerated. SEEK MEDICAL CARE IF:  You have unexplained abdominal pain.  You have abdominal pain associated with nausea or diarrhea.  You have pain when you urinate or have a bowel movement.  You experience abdominal pain that wakes you in the night.  You have abdominal pain that is worsened or improved by eating food.  You have abdominal pain that is worsened with eating fatty foods.  You have a fever. SEEK IMMEDIATE MEDICAL CARE IF:   Your pain does not go away within 2 hours.  You keep throwing up (vomiting).  Your pain is felt only in portions of the abdomen, such as the right side or the left lower portion of the abdomen.  You pass bloody or black tarry stools. MAKE SURE YOU:  Understand these instructions.   Will watch your condition.   Will get help right away  if you are not doing well or get worse.  Document Released: 01/17/2005 Document Revised: 04/14/2013 Document Reviewed: 12/17/2012 [facility] Patient Information 2015 [place], [place]. This information is not intended to replace advice given to you by your health care provider. Make sure you discuss any questions you have with your health care provider.

## 2014-12-03 NOTE — ED Notes (Signed)
Patient states he has had intermittent left lowerabdominal pain x several years, but has had more frequent episodes of pain in the past several months. Patient states he has also had weight loss-6 to 7 lbs in the last 2 weeks. Patient states the pain radiates into his umbilicus and to the right side at times. Patient states he has a poor appetite.Patient saw his PCP yesterday and had blood work done.

## 2014-12-03 NOTE — ED Notes (Signed)
Pt is resting comfortably in bed with family at the bedside.  He was given ginger ale per request.  Pt denies the need for pain medication at this time, reporting that he has no pain whatsoever.  No acute distress noted.

## 2014-12-03 NOTE — ED Provider Notes (Signed)
CSN: [id number]     Arrival date & time 12/03/14  1038 History   First MD Initiated Contact with Patient 12/03/14 1045     Chief Complaint  Patient presents with  . Abdominal Pain  . Fatigue     (Consider location/radiation/quality/duration/timing/severity/associated sxs/prior Treatment) HPI Melvin Fisher is a 59 y.o. male history of hypertension, hepatitis C, presents to emergency department complaining of ongoing abdominal pain, weakness, weight loss. Patient states he has had pain for multiple months. He has been seen by his primary care doctor and gastroenterologist. He had a CT scan done twice with the last 6 months which did not show any findings. He states that he has tried Levsin, and was diagnosed with irritable bowel syndrome. His gastroenterologist scheduled him for colonoscopy which he has coming up. Patient states the reason he came to emergency department is because he has a progressively losing weight over the last 2 weeks and states that since yesterday he lost 2 pounds. Patient denies any nausea or vomiting. Denies any diarrhea. He states his last bowel movement was today and was normal. He states that he has not eating as much because "it's hot and I just don't have appetite." He states he also started exercising more. Patient denies any fever or chills. No back pain. No night sweats. No other complaints.  Past Medical History  Diagnosis Date  . Hypertension   . Eczema   . BPH (benign prostatic hyperplasia)   . Diverticulosis   . Hemangioma of liver   . Palpitations   . Hepatitis C    Past Surgical History  Procedure Laterality Date  . Carpal tunnel release    . Rotator cuff repair     Family History  Problem Relation Age of Onset  . Hypertension Mother   . Cancer - Lung Father   . Hypertension Sister   . Diabetes Mellitus II Maternal Grandmother   . Hypertension Sister    Social History  Substance Use Topics  . Smoking status: Never Smoker   .  Smokeless tobacco: Never Used  . Alcohol Use: Yes     Comment: occasional    Review of Systems  Constitutional: Positive for appetite change and fatigue. Negative for fever and chills.  Respiratory: Negative for cough, chest tightness and shortness of breath.   Cardiovascular: Negative for chest pain, palpitations and leg swelling.  Gastrointestinal: Positive for abdominal pain. Negative for nausea, vomiting, diarrhea and abdominal distention.  Genitourinary: Negative for dysuria, urgency, frequency and hematuria.  Musculoskeletal: Negative for myalgias, arthralgias, neck pain and neck stiffness.  Skin: Negative for rash.  Allergic/Immunologic: Negative for immunocompromised state.  Neurological: Positive for weakness. Negative for dizziness, light-headedness, numbness and headaches.  All other systems reviewed and are negative.     Allergies  Review of patient's allergies indicates no known allergies.  Home Medications   Prior to Admission medications   Medication Sig Start Date End Date Taking? Authorizing Provider  lisinopril (PRINIVIL,ZESTRIL) 20 MG tablet Take 10 mg by mouth daily.    Yes Historical Provider, MD  naproxen sodium (ANAPROX) 220 MG tablet Take 440 mg by mouth every 12 (twelve) hours as needed (pain).   Yes Historical Provider, MD  simvastatin (ZOCOR) 20 MG tablet Take 20 mg by mouth every evening.   Yes Historical Provider, MD  triamcinolone lotion (KENALOG) 0.1 % Apply 1 application topically 3 (three) times daily as needed (skin irritation).    Yes Historical Provider, MD  dicyclomine (BENTYL) 20 MG tablet  Take 1 tablet (20 mg total) by mouth 2 (two) times daily. 12/03/14   Lyah Millirons, PA-C   BP 132/79 mmHg  Pulse 82  Temp(Src) 98.3 F (36.8 C) (Oral)  Resp 16  Ht 5\' 10"  (1.778 m)  Wt 191 lb (86.637 kg)  BMI 27.41 kg/m2  SpO2 100% Physical Exam  Constitutional: He is oriented to person, place, and time. He appears well-developed and  well-nourished. No distress.  HENT:  Head: Normocephalic and atraumatic.  Eyes: Conjunctivae are normal.  Neck: Neck supple.  Cardiovascular: Normal rate, regular rhythm and normal heart sounds.   Pulmonary/Chest: Effort normal. No respiratory distress. He has no wheezes. He has no rales.  Abdominal: Soft. Bowel sounds are normal. He exhibits no distension. There is no tenderness. There is no rebound and no guarding.  Musculoskeletal: He exhibits no edema.  Neurological: He is alert and oriented to person, place, and time.  Skin: Skin is warm and dry.  Nursing note and vitals reviewed.   ED Course  Procedures (including critical care time) Labs Review Labs Reviewed  COMPREHENSIVE METABOLIC PANEL - Abnormal; Notable for the following:    Glucose, Bld 105 (*)    BUN 23 (*)    Creatinine, Ser 1.27 (*)    All other components within normal limits  URINE CULTURE  CBC WITH DIFFERENTIAL/PLATELET  LIPASE, BLOOD  URINALYSIS, ROUTINE W REFLEX MICROSCOPIC (NOT AT [facility])    Imaging Review Dg Abd Acute W/chest  12/03/2014   CLINICAL DATA:  Worsening mid and lower abdominal pain for 2 weeks.  EXAM: DG ABDOMEN ACUTE W/ 1V CHEST  COMPARISON:  CT scan 09/30/2014  FINDINGS: The upright chest x-ray is normal.  Two views of the abdomen demonstrate an unremarkable bowel gas pattern. No findings for obstruction or perforation. The soft tissue shadows are maintained. No worrisome calcifications. The bony structures are unremarkable.  IMPRESSION: Normal chest x-ray.  No plain film findings for an acute abdominal process.   Electronically Signed   By: Marijo Sanes M.D.   On: 12/03/2014 12:18   I, Jeannett Senior A, personally reviewed and evaluated these images and lab results as part of my medical decision-making.   EKG Interpretation None      MDM   Final diagnoses:  Generalized abdominal pain    patient with intermittent abdominal pain, approximately 7 pound weight loss over last 2 weeks  that is unintentional, presents emergency department for evaluation. Patient has had evaluation for the same by a PCP and gastroenterology. He has a colonoscopy scheduled. He had 2 negative CTs in the last 6 months. he currently denies any nausea, vomiting, denies any abdominal pain at this time. No diarrhea. Exam unremarkable. Labs and x-ray reassuring. Most likely IBS, instructed to follow up to get colonoscopy. Will start on Bentyl, follow-up if not improving. Return if worsening.   Filed Vitals:   12/03/14 1050 12/03/14 1321  BP: 126/86 132/79  Pulse: 86 82  Temp: 98 F (36.7 C) 98.3 F (36.8 C)  TempSrc: Oral Oral  Resp: 16 16  Height: 5\' 10"  (1.778 m)   Weight: 191 lb (86.637 kg)   SpO2: 100% 100%     Jeannett Senior, PA-C 12/03/14 1709  Lacretia Leigh, MD 12/04/14 1054

## 2014-12-04 LAB — URINE CULTURE: CULTURE: NO GROWTH

## 2016-09-11 ENCOUNTER — Emergency Department (HOSPITAL_COMMUNITY): Payer: BLUE CROSS/BLUE SHIELD

## 2016-09-11 ENCOUNTER — Encounter (HOSPITAL_COMMUNITY): Payer: Self-pay | Admitting: Emergency Medicine

## 2016-09-11 ENCOUNTER — Emergency Department (HOSPITAL_COMMUNITY)
Admission: EM | Admit: 2016-09-11 | Discharge: 2016-09-11 | Disposition: A | Discharge status: Home / Self Care | Payer: BLUE CROSS/BLUE SHIELD | Attending: Emergency Medicine | Admitting: Emergency Medicine

## 2016-09-11 DIAGNOSIS — Z79899 Other long term (current) drug therapy: Secondary | ICD-10-CM | POA: Insufficient documentation

## 2016-09-11 DIAGNOSIS — R0602 Shortness of breath: Secondary | ICD-10-CM | POA: Insufficient documentation

## 2016-09-11 DIAGNOSIS — I10 Essential (primary) hypertension: Secondary | ICD-10-CM | POA: Insufficient documentation

## 2016-09-11 LAB — CBC WITH DIFFERENTIAL/PLATELET
Basophils Absolute: 0 10*3/uL (ref 0.0–0.1)
Basophils Relative: 0 %
Eosinophils Absolute: 0.2 10*3/uL (ref 0.0–0.7)
Eosinophils Relative: 3 %
HCT: 43.3 % (ref 39.0–52.0)
Hemoglobin: 14.7 g/dL (ref 13.0–17.0)
Lymphocytes Relative: 25 %
Lymphs Abs: 1.6 10*3/uL (ref 0.7–4.0)
MCH: 29.3 pg (ref 26.0–34.0)
MCHC: 33.9 g/dL (ref 30.0–36.0)
MCV: 86.3 fL (ref 78.0–100.0)
Monocytes Absolute: 0.3 10*3/uL (ref 0.1–1.0)
Monocytes Relative: 4 %
Neutro Abs: 4.6 10*3/uL (ref 1.7–7.7)
Neutrophils Relative %: 68 %
Platelets: 152 10*3/uL (ref 150–400)
RBC: 5.02 MIL/uL (ref 4.22–5.81)
RDW: 12.6 % (ref 11.5–15.5)
WBC: 6.7 10*3/uL (ref 4.0–10.5)

## 2016-09-11 LAB — BASIC METABOLIC PANEL
Anion gap: 9 (ref 5–15)
BUN: 18 mg/dL (ref 6–20)
CO2: 27 mmol/L (ref 22–32)
Calcium: 9.3 mg/dL (ref 8.9–10.3)
Chloride: 105 mmol/L (ref 101–111)
Creatinine, Ser: 1.3 mg/dL — ABNORMAL HIGH (ref 0.61–1.24)
GFR calc Af Amer: >60 mL/min (ref >60)
GFR calc non Af Amer: 58 mL/min — ABNORMAL LOW (ref >60)
Glucose, Bld: 112 mg/dL — ABNORMAL HIGH (ref 65–99)
Potassium: 4.1 mmol/L (ref 3.5–5.1)
Sodium: 141 mmol/L (ref 135–145)

## 2016-09-11 LAB — I-STAT TROPONIN, ED: TROPONIN I, POC: 0 ng/mL (ref 0.00–0.08)

## 2016-09-11 MED ORDER — ALBUTEROL SULFATE (2.5 MG/3ML) 0.083% IN NEBU
5.0000 mg | INHALATION_SOLUTION | Freq: Once | RESPIRATORY_TRACT | Status: AC
  Administered 2016-09-11: 5 mg via RESPIRATORY_TRACT
  Filled 2016-09-11: qty 6

## 2016-09-11 NOTE — Discharge Instructions (Signed)
Follow-up with your primary care physician for further evaluation of your heart and lungs. Return to the ED if any concerning symptoms develop.

## 2016-09-11 NOTE — ED Triage Notes (Signed)
Per pt, states SOB and chest tightness on and off for over a week-states he had his physical done and PCP was suppose to set up cardiology appointment but has'nt heard back from them

## 2016-09-11 NOTE — ED Provider Notes (Signed)
Medical screening examination/treatment/procedure(s) were conducted as a shared visit with non-physician practitioner(s) and myself.  I personally evaluated the patient during the encounter.   EKG Interpretation None     ED ECG REPORT   Date: 09/11/2016  Rate: 78  Rhythm: normal sinus rhythm  QRS Axis: normal  Intervals: normal  ST/T Wave abnormalities: normal  Conduction Disutrbances:none  Narrative Interpretation:   Old EKG Reviewed: none available  I have personally reviewed the EKG tracing and agree with the computerized printout as noted.  61 year old male here with intermittent dyspnea was last for seconds and seemed to come and go without exertional anginal type qualities. Chest x-ray and EKG are reassuring. Denies any syncope or near-syncope. Will follow with his doctor for an outpatient stress test   Lacretia Leigh, MD 09/11/16 1424

## 2016-09-11 NOTE — ED Provider Notes (Signed)
[facility] DEPT Provider Note   CSN: [id number] Arrival date & time: 09/11/16  1107     History   Chief Complaint Chief Complaint  Patient presents with  . Shortness of Breath    HPI Melvin Fisher is a 62 y.o. male with history of HLD, HTN, well-controlled DM, palpitations and hypertension who presents today with chief complaint intermittent, gradually worsening shortness of breath for 2 weeks. He states for the past 2 weeks he "can't get enough air in " feels an associated chest tightness which is intermittent and midsternal and does not radiate for the past 2 weeks as well. He states he feels this chest tightness when he eats 2. Endorses associated lightheadedness but denies syncope, diaphoresis, orthopnea, PND, leg swelling. He has a history of intermittent palpitations which she felt sometime last week most recently, which she stops by bending over to touch his toes. This resolved his palpitations. He states last week he had a physical with his primary care and mentioned the symptoms. He states his workup was negative, and his primary care wanted to send him for a stress test and EKG but no one called him. He states he does not feel excessively short of breath while exercising. He is not a smoker and has never been a smoker, no prior history of MI, no prior catheterizations. He denies recent travel or surgery, is not on any hormone or placement therapy, denies hemoptysis, and no prior hx of clots. The history is provided by the patient.    Past Medical History:  Diagnosis Date  . BPH (benign prostatic hyperplasia)   . Diverticulosis   . Eczema   . Hemangioma of liver   . Hepatitis C   . Hypertension   . Palpitations     There are no active problems to display for this patient.   Past Surgical History:  Procedure Laterality Date  . CARPAL TUNNEL RELEASE    . ROTATOR CUFF REPAIR         Home Medications    Prior to Admission medications   Medication Sig Start  Date End Date Taking? Authorizing Provider  dicyclomine (BENTYL) 20 MG tablet Take 1 tablet (20 mg total) by mouth 2 (two) times daily. 12/03/14  Yes Kirichenko, Tatyana, PA-C  lisinopril (PRINIVIL,ZESTRIL) 20 MG tablet Take 10 mg by mouth daily.    Yes [provider]  naproxen sodium (ANAPROX) 220 MG tablet Take 440 mg by mouth every 12 (twelve) hours as needed (pain).   Yes [provider]  simvastatin (ZOCOR) 20 MG tablet Take 20 mg by mouth every evening.   Yes [provider]  triamcinolone lotion (KENALOG) 0.1 % Apply 1 application topically 3 (three) times daily as needed (skin irritation).    Yes [provider]    Family History Family History  Problem Relation Age of Onset  . Hypertension Mother   . Cancer - Lung Father   . Hypertension Sister   . Hypertension Sister   . Diabetes Mellitus II Maternal Grandmother     Social History Social History  Substance Use Topics  . Smoking status: Never Smoker  . Smokeless tobacco: Never Used  . Alcohol use Yes     Comment: occasional     Allergies   Patient has no known allergies.   Review of Systems Review of Systems  Constitutional: Negative for chills and fever.  Respiratory: Positive for chest tightness and shortness of breath.   Cardiovascular: Positive for palpitations. Negative for leg  swelling.  Gastrointestinal: Negative for abdominal pain, diarrhea, nausea and vomiting.  Genitourinary: Negative for dysuria, flank pain and hematuria.  Musculoskeletal: Negative for back pain.  Neurological: Positive for light-headedness.  All other systems reviewed and are negative.    Physical Exam Updated Vital Signs BP 117/85 (BP Location: Left Arm)   Pulse 60   Temp 98.2 F (36.8 C) (Oral)   Resp 14   SpO2 100%   Physical Exam  Constitutional: He appears well-developed and well-nourished.  HENT:  Head: Normocephalic and atraumatic.  Eyes: Right eye exhibits no discharge. Left eye  exhibits no discharge. No scleral icterus.  Neck: No JVD present. No tracheal deviation present.  Cardiovascular: Normal rate, regular rhythm, normal heart sounds and intact distal pulses.   2+ radial and DP/PT pulses bl, negative Homan's bl   Pulmonary/Chest: Effort normal and breath sounds normal. He exhibits no tenderness.  Abdominal: Soft. Bowel sounds are normal. He exhibits no distension. There is no tenderness.  Musculoskeletal: He exhibits no edema.  No midline spine ttp. No paraspinal muscle tenderness  Neurological: He is alert.  Fluent speech, no facial droop  Skin: Skin is warm and dry.  Psychiatric: He has a normal mood and affect. His behavior is normal.     ED Treatments / Results  Labs (all labs ordered are listed, but only abnormal results are displayed) Labs Reviewed  BASIC METABOLIC PANEL - Abnormal; Notable for the following:       Result Value   Glucose, Bld 112 (*)    Creatinine, Ser 1.30 (*)    GFR calc non Af Amer 58 (*)    All other components within normal limits  CBC WITH DIFFERENTIAL/PLATELET  Randolm Idol, ED    EKG  EKG Interpretation None       Radiology Dg Chest 2 View  Result Date: 09/11/2016 CLINICAL DATA:  Shortness of breath, chest tightness EXAM: CHEST  2 VIEW COMPARISON:  12/03/2014 FINDINGS: Heart and mediastinal contours are within normal limits. No focal opacities or effusions. No acute bony abnormality. IMPRESSION: No active cardiopulmonary disease. Electronically Signed   By: Rolm Baptise M.D.   On: 09/11/2016 12:09    Procedures Procedures (including critical care time)  Medications Ordered in ED Medications  albuterol (PROVENTIL) (2.5 MG/3ML) 0.083% nebulizer solution 5 mg (5 mg Nebulization Given 09/11/16 1210)     Initial Impression / Assessment and Plan / ED Course  I have reviewed the triage vital signs and the nursing notes.  Pertinent labs & imaging results that were available during my care of the patient  were reviewed by me and considered in my medical decision making (see chart for details).     Patient with intermittent shortness of breath for 2 weeks. Afebrile, vital signs stable, SPO2 100% on room air. No improvement with breathing treatment. Lungs clear to auscultation. Labwork do not show any acute changes. EKG shows sinus rhythm and is not concerning for ACS or MI. Low suspicion PE with no risk factors. HEART score 3. Chest x-ray negative for any acute cardio pulmonary abnormality. Low suspicion of pneumonia, bronchitis. Troponin negative. No further emergent workup required. Patient will follow-up with his primary care physician to schedule further cardiac workup including stress test and possible Holter monitor. Discussed indications for return to the ED. Pt verbalized understanding of and agreement with plan and is safe for discharge home at this time. Patient seen and evaluated by Dr. Zenia Resides.   Final Clinical Impressions(s) / ED Diagnoses  Final diagnoses:  SOB (shortness of breath)    New Prescriptions New Prescriptions   No medications on file     Renita Papa, PA-C 09/11/16 1525

## 2016-09-13 ENCOUNTER — Encounter: Payer: Self-pay | Admitting: Cardiovascular Disease

## 2016-09-13 ENCOUNTER — Ambulatory Visit (INDEPENDENT_AMBULATORY_CARE_PROVIDER_SITE_OTHER): Payer: Self-pay | Admitting: Cardiovascular Disease

## 2016-09-13 VITALS — BP 114/80 | HR 80 | Ht 70.0 in | Wt 201.0 lb

## 2016-09-13 DIAGNOSIS — E7800 Pure hypercholesterolemia, unspecified: Secondary | ICD-10-CM

## 2016-09-13 DIAGNOSIS — R0602 Shortness of breath: Secondary | ICD-10-CM

## 2016-09-13 DIAGNOSIS — R0789 Other chest pain: Secondary | ICD-10-CM

## 2016-09-13 DIAGNOSIS — I10 Essential (primary) hypertension: Secondary | ICD-10-CM

## 2016-09-13 DIAGNOSIS — E785 Hyperlipidemia, unspecified: Secondary | ICD-10-CM | POA: Insufficient documentation

## 2016-09-13 MED ORDER — PANTOPRAZOLE SODIUM 40 MG PO TBEC: 40.0000 mg | DELAYED_RELEASE_TABLET | Freq: Every day | ORAL | 11 refills | Status: DC

## 2016-09-13 NOTE — Patient Instructions (Addendum)
Medication Instructions: Your physician recommends that you continue on your current medications as directed. Please refer to the Current Medication list given to you today.  START Protonix 40 mg daily.  Labwork: I will request labs from Dr. Stephanie Acre.  Testing/Procedures: Your physician has requested that you have an exercise tolerance test. For further information please visit [email or web address]. Please also follow instruction sheet, as given.  Your physician has requested that you have an echocardiogram. Echocardiography is a painless test that uses sound waves to create images of your heart. It provides your doctor with information about the size and shape of your heart and how well your heart's chambers and valves are working. This procedure takes approximately one hour. There are no restrictions for this procedure.  Follow-Up: Your physician recommends that you schedule a follow-up appointment after testing with Dr. Gwenlyn Found.

## 2016-09-13 NOTE — Assessment & Plan Note (Addendum)
History of essential hypertension blood pressure 114/62. He is on lisinopril. Current meds at current dosing.

## 2016-09-13 NOTE — Assessment & Plan Note (Signed)
History shortness of breath last 2 weeks. He feels like he cannot get a "deep breath. This is not the source is here with activity. He does not smoke. He was evaluated in the emergency room where chest x-ray was clear and the pH and I think this is pneumonia or bronchitis. I am going to get a 2-D echo to evaluate LV function.

## 2016-09-13 NOTE — Assessment & Plan Note (Signed)
History of hyperlipidemia on statin therapy with recent lipid profile performed by his PCP 09/04/16 for a cholesterol of 152, LDL of 95 and HDL 47.

## 2016-09-13 NOTE — Progress Notes (Signed)
09/13/2016 MEYER ARORA   1956/04/12  [id number]  Primary Physician [name], MD Primary Cardiologist: Lorretta Harp MD Renae Gloss  HPI:  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 He was referred by Dr. Stephanie Acre for cardiovascular evaluation because of new onset shortness of breath and atypical epigastric pain. He has no prior cardiac history. Risk factors include treated hypertension, hyperlipidemia and non-insulin-dependent diabetes. There is no family history. He does not smoke. He's had palpitations for years with her every 6 months or so which is fairly self-limited. He developed shortness of breath 2 weeks ago along with some epigastric pain. He is seen in the ER 2 days ago where workup was unremarkable.   Current Outpatient Prescriptions  Medication Sig Dispense Refill  . lisinopril (PRINIVIL,ZESTRIL) 20 MG tablet Take 10 mg by mouth daily.     . naproxen sodium (ANAPROX) 220 MG tablet Take 440 mg by mouth every 12 (twelve) hours as needed (pain).    . simvastatin (ZOCOR) 20 MG tablet Take 20 mg by mouth every evening.    . triamcinolone lotion (KENALOG) 0.1 % Apply 1 application topically 3 (three) times daily as needed (skin irritation).     . pantoprazole (PROTONIX) 40 MG tablet Take 1 tablet (40 mg total) by mouth daily. 30 tablet 11   No current facility-administered medications for this visit.     No Known Allergies  Social History   Social History  . Marital status: Married    Spouse name: N/A  . Number of children: N/A  . Years of education: N/A   Occupational History  . Not on file.   Social History Main Topics  . Smoking status: Never Smoker  . Smokeless tobacco: Never Used  . Alcohol use Yes     Comment: occasional  . Drug use: No  . Sexual activity: Not on file     Other Topics Concern  . Not on file   Social History Narrative  . No narrative on file     Review of Systems: General: negative for chills, fever, night sweats or weight changes.  Cardiovascular: negative for chest pain, dyspnea on exertion, edema, orthopnea, palpitations, paroxysmal nocturnal dyspnea or shortness of breath Dermatological: negative for rash Respiratory: negative for cough or wheezing Urologic: negative for hematuria Abdominal: negative for nausea, vomiting, diarrhea, bright red blood per rectum, melena, or hematemesis Neurologic: negative for visual changes, syncope, or dizziness All other systems reviewed and are otherwise negative except as noted above.    Blood pressure 114/80, pulse 80, height 5\' 10"  (1.778 m), weight 201 lb (91.2 kg).  General appearance: alert and no distress Neck: no adenopathy, no carotid bruit, no JVD, supple, symmetrical, trachea midline and thyroid not enlarged, symmetric, no tenderness/mass/nodules Lungs: clear to auscultation bilaterally Heart: regular rate and rhythm, S1, S2 normal, no murmur, click, rub or gallop Extremities: extremities normal, atraumatic, no cyanosis or edema Pulses: 2+ and symmetric  EKG not performed today, EKG performed in the emergency room on 09/11/16 revealed sinus rhythm at 78 without ST or T-wave changes. Personally reviewed that EKG  ASSESSMENT AND PLAN:   Hyperlipidemia History of hyperlipidemia on statin therapy with recent lipid profile performed by his PCP 09/04/16 for a cholesterol of 152, LDL of 95 and HDL 47.  Essential hypertension History of essential hypertension  blood pressure 114/62. He is on lisinopril. Current meds at current dosing.  Shortness of breath History shortness of breath last 2 weeks. He feels like he cannot get a "deep breath. This is not the source is here with activity. He does not smoke. He was evaluated in the emergency room where chest x-ray was clear and the pH and I  think this is pneumonia or bronchitis. I am going to get a 2-D echo to evaluate LV function.  Atypical chest pain History of atypical epigastric pain in the setting shortness of breath. This is not necessarily associated with activity although is associated with eating. I'm going to get a routine GXT and begin him empirically on Protonix.      Lorretta Harp MD FACP,FACC,FAHA, [facility] 61/24/2018 1:30 PM

## 2016-09-13 NOTE — Assessment & Plan Note (Signed)
History of atypical epigastric pain in the setting shortness of breath. This is not necessarily associated with activity although is associated with eating. I'm going to get a routine GXT and begin him empirically on Protonix.

## 2016-09-27 ENCOUNTER — Other Ambulatory Visit: Payer: Self-pay

## 2016-09-27 ENCOUNTER — Ambulatory Visit (HOSPITAL_COMMUNITY): Payer: BLUE CROSS/BLUE SHIELD | Attending: Cardiology

## 2016-09-27 DIAGNOSIS — R079 Chest pain, unspecified: Secondary | ICD-10-CM | POA: Diagnosis present

## 2016-09-27 DIAGNOSIS — R0602 Shortness of breath: Secondary | ICD-10-CM | POA: Insufficient documentation

## 2016-09-27 DIAGNOSIS — I083 Combined rheumatic disorders of mitral, aortic and tricuspid valves: Secondary | ICD-10-CM | POA: Insufficient documentation

## 2016-09-27 DIAGNOSIS — E785 Hyperlipidemia, unspecified: Secondary | ICD-10-CM | POA: Diagnosis not present

## 2016-09-27 DIAGNOSIS — R0789 Other chest pain: Secondary | ICD-10-CM | POA: Insufficient documentation

## 2016-09-27 DIAGNOSIS — R0600 Dyspnea, unspecified: Secondary | ICD-10-CM | POA: Diagnosis present

## 2016-09-27 DIAGNOSIS — I119 Hypertensive heart disease without heart failure: Secondary | ICD-10-CM | POA: Diagnosis not present

## 2016-10-05 ENCOUNTER — Telehealth (HOSPITAL_COMMUNITY): Payer: Self-pay

## 2016-10-05 NOTE — Telephone Encounter (Signed)
Encounter complete. 

## 2016-10-10 ENCOUNTER — Ambulatory Visit (HOSPITAL_COMMUNITY)
Admission: RE | Admit: 2016-10-10 | Discharge: 2016-10-10 | Disposition: A | Discharge status: Home / Self Care | Payer: BLUE CROSS/BLUE SHIELD | Source: Ambulatory Visit | Attending: Cardiovascular Disease | Admitting: Cardiovascular Disease

## 2016-10-10 DIAGNOSIS — R0602 Shortness of breath: Secondary | ICD-10-CM | POA: Diagnosis not present

## 2016-10-10 DIAGNOSIS — R0789 Other chest pain: Secondary | ICD-10-CM

## 2016-10-10 LAB — EXERCISE TOLERANCE TEST
CHL CUP MPHR: 160 {beats}/min
CHL CUP RESTING HR STRESS: 82 {beats}/min
CHL RATE OF PERCEIVED EXERTION: 16
CSEPEDS: 9 s
CSEPEW: 13.4 METS
Exercise duration (min): 11 min
Peak HR: 155 {beats}/min
Percent HR: 97 %

## 2016-10-23 ENCOUNTER — Ambulatory Visit (INDEPENDENT_AMBULATORY_CARE_PROVIDER_SITE_OTHER): Payer: BLUE CROSS/BLUE SHIELD | Admitting: Cardiovascular Disease

## 2016-10-23 ENCOUNTER — Encounter: Payer: Self-pay | Admitting: Cardiovascular Disease

## 2016-10-23 DIAGNOSIS — I10 Essential (primary) hypertension: Secondary | ICD-10-CM

## 2016-10-23 DIAGNOSIS — R0602 Shortness of breath: Secondary | ICD-10-CM

## 2016-10-23 DIAGNOSIS — E7800 Pure hypercholesterolemia, unspecified: Secondary | ICD-10-CM | POA: Diagnosis not present

## 2016-10-23 DIAGNOSIS — R0789 Other chest pain: Secondary | ICD-10-CM

## 2016-10-23 NOTE — Patient Instructions (Signed)
Medication Instructions: Your physician recommends that you continue on your current medications as directed. Please refer to the Current Medication list given to you today.   Follow-Up: Your physician recommends that you schedule a follow-up appointment as needed with Dr. Berry.    

## 2016-10-23 NOTE — Assessment & Plan Note (Signed)
Complaints of shortness of breath which is somewhat improved as well. His 2-D echo was completely normal. He is nonsmoker.

## 2016-10-23 NOTE — Assessment & Plan Note (Addendum)
History of hypertension blood pressure meshed at 122/81. He is on lisinopril. Continue current meds at current dosing

## 2016-10-23 NOTE — Assessment & Plan Note (Signed)
History of hyperlipidemia on statin therapy followed by his PCP 

## 2016-10-23 NOTE — Progress Notes (Signed)
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 He was referred by Dr. Stephanie Acre for cardiovascular evaluation because of new onset shortness of breath and atypical epigastric pain. I last saw him in the office 09/13/16. He has no prior cardiac history. Risk factors include treated hypertension, hyperlipidemia and non-insulin-dependent diabetes. There is no family history. He does not smoke. He's had palpitations for years with her every 6 months or so which is fairly self-limited. He had developed shortness of breath, and epigastric pain prior to his last office visit. Routine GXT which we performed our office 10/10/16 was normal as well as a 2-D echocardiogram. These Symptoms have somewhat subsided. He does admit to being under a lot of stress at that time.  Current Outpatient Prescriptions  Medication Sig Dispense Refill  . lisinopril (PRINIVIL,ZESTRIL) 20 MG tablet Take 10 mg by mouth daily.     . naproxen sodium (ANAPROX) 220 MG tablet Take 440 mg by mouth every 12 (twelve) hours as needed (pain).    . pantoprazole (PROTONIX) 40 MG tablet Take 1 tablet (40 mg total) by mouth daily. 30 tablet 11  . simvastatin (ZOCOR) 20 MG tablet Take 20 mg by mouth every evening.    . triamcinolone lotion (KENALOG) 0.1 % Apply 1 application topically 3 (three) times daily as needed (skin irritation).      No current facility-administered medications for this visit.     No Known Allergies  Social History   Social History  . Marital status: Married    Spouse name: N/A  . Number of children: N/A  . Years of education: N/A   Occupational History  . Not on file.   Social History  Main Topics  . Smoking status: Never Smoker  . Smokeless tobacco: Never Used  . Alcohol use Yes     Comment: occasional  . Drug use: No  . Sexual activity: Not on file   Other Topics Concern  . Not on file   Social History Narrative  . No narrative on file     Review of Systems: General: negative for chills, fever, night sweats or weight changes.  Cardiovascular: negative for chest pain, dyspnea on exertion, edema, orthopnea, palpitations, paroxysmal nocturnal dyspnea or shortness of breath Dermatological: negative for rash Respiratory: negative for cough or wheezing Urologic: negative for hematuria Abdominal: negative for nausea, vomiting, diarrhea, bright red blood per rectum, melena, or hematemesis Neurologic: negative for visual changes, syncope, or dizziness All other systems reviewed and are otherwise negative except as noted above.    Blood pressure 122/81, pulse 75, height 5\' 10"  (1.778 m), weight 207 lb 3.2 oz (94 kg), SpO2 100 %.  General appearance: alert and no distress Neck: no adenopathy, no carotid bruit, no JVD, supple, symmetrical, trachea midline and thyroid not enlarged, symmetric, no tenderness/mass/nodules Lungs: clear to auscultation bilaterally Heart: regular rate and rhythm, S1, S2 normal, no murmur, click, rub or gallop Extremities: extremities normal, atraumatic, no cyanosis or edema  EKG not performed today  ASSESSMENT AND PLAN:   Atypical chest pain History of atypical chest/epigastric pain which has the most part resolved. He did have a normal GXT 10/10/16.  Shortness of breath Complaints  of shortness of breath which is somewhat improved as well. His 2-D echo was completely normal. He is nonsmoker.  Essential hypertension History of hypertension blood pressure meshed at 122/81. He is on lisinopril. Continue current meds at current dosing  Hyperlipidemia History of hyperlipidemia on statin therapy followed by his PCP.      Lorretta Harp MD [place], [facility] 10/23/2016 8:47 AM

## 2016-10-23 NOTE — Assessment & Plan Note (Signed)
History of atypical chest/epigastric pain which has the most part resolved. He did have a normal GXT 10/10/16.

## 2018-03-03 ENCOUNTER — Emergency Department (HOSPITAL_COMMUNITY): Payer: BLUE CROSS/BLUE SHIELD

## 2018-03-03 ENCOUNTER — Emergency Department (HOSPITAL_COMMUNITY)
Admission: EM | Admit: 2018-03-03 | Discharge: 2018-03-03 | Disposition: A | Discharge status: Home / Self Care | Payer: BLUE CROSS/BLUE SHIELD | Attending: Emergency Medicine | Admitting: Emergency Medicine

## 2018-03-03 ENCOUNTER — Other Ambulatory Visit: Payer: Self-pay

## 2018-03-03 ENCOUNTER — Encounter (HOSPITAL_COMMUNITY): Payer: Self-pay | Admitting: Obstetrics and Gynecology

## 2018-03-03 DIAGNOSIS — G459 Transient cerebral ischemic attack, unspecified: Secondary | ICD-10-CM

## 2018-03-03 DIAGNOSIS — R202 Paresthesia of skin: Secondary | ICD-10-CM | POA: Diagnosis present

## 2018-03-03 DIAGNOSIS — Z7982 Long term (current) use of aspirin: Secondary | ICD-10-CM | POA: Insufficient documentation

## 2018-03-03 DIAGNOSIS — Z79899 Other long term (current) drug therapy: Secondary | ICD-10-CM | POA: Insufficient documentation

## 2018-03-03 DIAGNOSIS — I10 Essential (primary) hypertension: Secondary | ICD-10-CM | POA: Diagnosis not present

## 2018-03-03 DIAGNOSIS — R200 Anesthesia of skin: Secondary | ICD-10-CM

## 2018-03-03 LAB — CBC WITH DIFFERENTIAL/PLATELET
Abs Immature Granulocytes: 0.05 10*3/uL (ref 0.00–0.07)
BASOS ABS: 0 10*3/uL (ref 0.0–0.1)
BASOS PCT: 1 %
EOS ABS: 0.4 10*3/uL (ref 0.0–0.5)
EOS PCT: 5 %
HCT: 47.4 % (ref 39.0–52.0)
HEMOGLOBIN: 15.6 g/dL (ref 13.0–17.0)
Immature Granulocytes: 1 %
LYMPHS PCT: 17 %
Lymphs Abs: 1.3 10*3/uL (ref 0.7–4.0)
MCH: 29.3 pg (ref 26.0–34.0)
MCHC: 32.9 g/dL (ref 30.0–36.0)
MCV: 89.1 fL (ref 80.0–100.0)
MONO ABS: 0.5 10*3/uL (ref 0.1–1.0)
Monocytes Relative: 7 %
NRBC: 0 % (ref 0.0–0.2)
Neutro Abs: 5 10*3/uL (ref 1.7–7.7)
Neutrophils Relative %: 69 %
PLATELETS: 160 10*3/uL (ref 150–400)
RBC: 5.32 MIL/uL (ref 4.22–5.81)
RDW: 12.2 % (ref 11.5–15.5)
WBC: 7.2 10*3/uL (ref 4.0–10.5)

## 2018-03-03 LAB — TROPONIN I: Troponin I: <0.03 ng/mL (ref <0.03)

## 2018-03-03 LAB — COMPREHENSIVE METABOLIC PANEL
ALBUMIN: 4.1 g/dL (ref 3.5–5.0)
ALK PHOS: 54 U/L (ref 38–126)
ALT: 15 U/L (ref 0–44)
ANION GAP: 7 (ref 5–15)
AST: 24 U/L (ref 15–41)
BUN: 19 mg/dL (ref 8–23)
CALCIUM: 9.3 mg/dL (ref 8.9–10.3)
CO2: 26 mmol/L (ref 22–32)
Chloride: 105 mmol/L (ref 98–111)
Creatinine, Ser: 1.26 mg/dL — ABNORMAL HIGH (ref 0.61–1.24)
GFR calc non Af Amer: 59 mL/min — ABNORMAL LOW (ref >60)
GLUCOSE: 116 mg/dL — AB (ref 70–99)
POTASSIUM: 4.3 mmol/L (ref 3.5–5.1)
SODIUM: 138 mmol/L (ref 135–145)
TOTAL PROTEIN: 7.1 g/dL (ref 6.5–8.1)
Total Bilirubin: 1 mg/dL (ref 0.3–1.2)

## 2018-03-03 MED ORDER — LORAZEPAM 2 MG/ML IJ SOLN
0.5000 mg | Freq: Once | INTRAMUSCULAR | Status: AC | PRN
  Administered 2018-03-03: 0.5 mg via INTRAVENOUS
  Filled 2018-03-03: qty 1

## 2018-03-03 MED ORDER — ZOLPIDEM TARTRATE 5 MG PO TABS: 5.0000 mg | ORAL_TABLET | Freq: Every evening | ORAL | 0 refills | Status: DC | PRN

## 2018-03-03 NOTE — ED Provider Notes (Signed)
Care assumed from Dr. Billy Fischer at shift change.  Patient presented with complaints of numbness to his left face, left arm, and left leg.  His complaints are solely sensory and there are no strength deficits.  Work-up thus far reveals unremarkable laboratory studies.  He was signed out to me awaiting an MRI of his brain.  This was obtained and was unremarkable.  This patient was discussed with Dr. Rory Percy from neurology.  He feels as though the patient is appropriate for completion of his work-up as an outpatient as he is neurologically intact and symptoms were sensory in nature.  Patient will be discharged and is to follow-up with neurology for completion of his work-up and consultation.  I will advised him to increase his aspirin to 325 mg daily until seen by them.   Veryl Speak, MD 03/03/18 1200

## 2018-03-03 NOTE — ED Notes (Signed)
ED Provider at bedside. DELO 

## 2018-03-03 NOTE — ED Provider Notes (Signed)
[facility] DEPT Provider Note   CSN: [id number] Arrival date & time: 03/03/18  0520     History   Chief Complaint Chief Complaint  Patient presents with  . Tingling    HPI Melvin Fisher is a 62 y.o. male.  HPI   Left arm tingling began yesterday evening around 5PM Began while watching TV, has been off and on, maybe better when lifting arm above his head Throat feels dry/tightness No chest pain/pressure/tightness No shortness of breath or nausea Not worse with movements or exertion No trauma, falls or neck pain No fevers or cough Mild dull headache Feels like throbbing in left leg, mild tingling in left leg No facial droop, left side of lip with tingling Pressure to face No change in vision No trouble walking or talking  09/2016 had stress test Past Medical History:  Diagnosis Date  . BPH (benign prostatic hyperplasia)   . Diverticulosis   . Eczema   . Hemangioma of liver   . Hepatitis C   . Hypertension   . Palpitations     Patient Active Problem List   Diagnosis Date Noted  . Shortness of breath 09/13/2016  . Atypical chest pain 09/13/2016  . Essential hypertension 09/13/2016  . Hyperlipidemia 09/13/2016    Past Surgical History:  Procedure Laterality Date  . CARPAL TUNNEL RELEASE    . ROTATOR CUFF REPAIR          Home Medications    Prior to Admission medications   Medication Sig Start Date End Date Taking? Authorizing Provider  aspirin EC 81 MG tablet Take 81 mg by mouth daily.   Yes [provider]  lisinopril (PRINIVIL,ZESTRIL) 20 MG tablet Take 10 mg by mouth daily.    Yes [provider]  meloxicam (MOBIC) 15 MG tablet Take 15 mg by mouth daily as needed for pain.  11/26/17  Yes [provider]  pravastatin (PRAVACHOL) 20 MG tablet Take 20 mg by mouth every other day.   Yes [provider]  pantoprazole (PROTONIX) 40 MG tablet Take 1 tablet (40 mg total) by mouth  daily. Patient not taking: Reported on 03/03/2018 09/13/16   Lorretta Harp, MD    Family History Family History  Problem Relation Age of Onset  . Hypertension Mother   . Cancer - Lung Father   . Hypertension Sister   . Hypertension Sister   . Diabetes Mellitus II Maternal Grandmother     Social History Social History   Tobacco Use  . Smoking status: Never Smoker  . Smokeless tobacco: Never Used  Substance Use Topics  . Alcohol use: Yes    Comment: occasional  . Drug use: No     Allergies   Patient has no known allergies.   Review of Systems Review of Systems  Constitutional: Negative for fever.  HENT: Negative for sore throat.   Eyes: Negative for visual disturbance.  Respiratory: Negative for shortness of breath.   Cardiovascular: Negative for chest pain.  Gastrointestinal: Negative for abdominal pain, constipation, diarrhea, nausea and vomiting.  Genitourinary: Negative for difficulty urinating.  Musculoskeletal: Negative for back pain and neck stiffness.  Skin: Negative for rash.  Neurological: Positive for numbness and headaches. Negative for syncope, facial asymmetry, speech difficulty and weakness.     Physical Exam Updated Vital Signs BP (!) 125/92   Pulse 72   Temp 97.6 F (36.4 C) (Oral)   Resp 15   Ht 5\' 10"  (1.778 m)   Wt  93 kg   SpO2 99%   BMI 29.41 kg/m   Physical Exam  Constitutional: He is oriented to person, place, and time. He appears well-developed and well-nourished. No distress.  HENT:  Head: Normocephalic and atraumatic.  Eyes: Conjunctivae and EOM are normal.  Neck: Normal range of motion.  Cardiovascular: Normal rate, regular rhythm, normal heart sounds and intact distal pulses. Exam reveals no gallop and no friction rub.  No murmur heard. Pulmonary/Chest: Effort normal and breath sounds normal. No respiratory distress. He has no wheezes. He has no rales.  Abdominal: Soft. He exhibits no distension. There is no tenderness.  There is no guarding.  Musculoskeletal: He exhibits no edema.  Neurological: He is alert and oriented to person, place, and time. He has normal strength. No cranial nerve deficit or sensory deficit. Coordination and gait normal. GCS eye subscore is 4. GCS verbal subscore is 5. GCS motor subscore is 6.  Skin: Skin is warm and dry. He is not diaphoretic.  Nursing note and vitals reviewed.    ED Treatments / Results  Labs (all labs ordered are listed, but only abnormal results are displayed) Labs Reviewed  COMPREHENSIVE METABOLIC PANEL - Abnormal; Notable for the following components:      Result Value   Glucose, Bld 116 (*)    Creatinine, Ser 1.26 (*)    GFR calc non Af Amer 59 (*)    All other components within normal limits  CBC WITH DIFFERENTIAL/PLATELET  TROPONIN I  I-STAT TROPONIN, ED    EKG None  Radiology No results found.  Procedures Procedures (including critical care time)  Medications Ordered in ED Medications  LORazepam (ATIVAN) injection 0.5 mg (0.5 mg Intravenous Given 03/03/18 0659)     Initial Impression / Assessment and Plan / ED Course  I have reviewed the triage vital signs and the nursing notes.  Pertinent labs & imaging results that were available during my care of the patient were reviewed by me and considered in my medical decision making (see chart for details).     62yo male with history of hypertension, hyperlipidemia presents with concern for left sided tingling.  EKG without acute changes.  Given primarily left arm symptoms, will obtain delta troponins for cardiac work up--IF negative, given nonexertional symptoms, negative stress test last year, feel continued evaluation can be done through Dr. Gwenlyn Found Cardiology as outpatient.    While he has primarily left arm tingling, he also reports tingling of the left leg, and left side of the face.  His neurologic exam is normal except for some paresthesias in the left arm.  Given risk factors, will  obtain MRI to evaluate for CVA.  If negative, he can follow-up with his primary care physician as an outpatient.  Have low suspicion for TIA. Has had similar work up in 09/2014.    Care signed out to Dr. Stark Jock with troponin, MRI pending.   Final Clinical Impressions(s) / ED Diagnoses   Final diagnoses:  Paresthesia  Numbness and tingling in left arm    ED Discharge Orders    None       Gareth Morgan, MD 03/03/18 [id number]

## 2018-03-03 NOTE — ED Notes (Signed)
Per MRI, they do not know when test will get done, states they are short staffed at this time and they will call when they are ready for patient

## 2018-03-03 NOTE — ED Notes (Signed)
Off floor to MRI

## 2018-03-03 NOTE — Discharge Instructions (Addendum)
Increase your aspirin to 325 mg daily until followed up by neurology.  You are to follow-up with outpatient neurology.  The contact information for [facility] neurology has been provided in this discharge summary for you to call and make these arrangements.  Return to the emergency department in the meantime if your symptoms significantly worsen or change.

## 2018-03-03 NOTE — ED Notes (Signed)
RX X 1 GIVEN

## 2018-03-03 NOTE — ED Triage Notes (Signed)
Per PT: Pt reports he is having tingling in his left arm. Pt reports it feels numb.  Pt is alert and oriented and in no active distress at this time.  Pt denies N/V/D.

## 2018-03-04 ENCOUNTER — Ambulatory Visit: Payer: BLUE CROSS/BLUE SHIELD | Admitting: Neurology

## 2018-03-04 ENCOUNTER — Encounter: Payer: Self-pay | Admitting: Neurology

## 2018-03-04 ENCOUNTER — Telehealth: Payer: Self-pay | Admitting: Neurology

## 2018-03-04 VITALS — BP 110/78 | HR 81 | Ht 70.0 in | Wt 202.4 lb

## 2018-03-04 DIAGNOSIS — G459 Transient cerebral ischemic attack, unspecified: Secondary | ICD-10-CM | POA: Diagnosis not present

## 2018-03-04 DIAGNOSIS — R202 Paresthesia of skin: Secondary | ICD-10-CM | POA: Diagnosis not present

## 2018-03-04 DIAGNOSIS — R29818 Other symptoms and signs involving the nervous system: Secondary | ICD-10-CM | POA: Diagnosis not present

## 2018-03-04 MED ORDER — CLOPIDOGREL BISULFATE 75 MG PO TABS: 75.0000 mg | ORAL_TABLET | Freq: Every day | ORAL | 11 refills | Status: DC

## 2018-03-04 NOTE — Progress Notes (Addendum)
[facility]    Provider:  Dr Jaynee Eagles Referring Provider: ED Primary Care Physician:  [name], MD  CC:  Facial numbness  HPI:  Melvin Fisher is a 62 y.o. male here as requested by Dr. Stephanie Acre for facial numbness. PMHx hld, HTN, CP (echo normal in 2018 per Dr. Gwenlyn Found), diabetes, hep C. Here with his wife who also provides information. It was 5pm, he threw his arm back around his wife and the fingers started tingling, he sometimes has tingling in the left hand, sometimes at night especially if laying on it. Sometimes he has heart palpitations (he has been evaluated), heart starts beating an that's when he feel dizziness and some left arm tingling. When he had the mori he was having symptoms for 24 hours and MRi was negative. He takes a baby aspirin and manages his other risk factors. He does a lot of lifting and a lot of manual work. +snoring. Frequent awakenings.   Reviewed notes, labs and imaging from outside physicians, which showed:  Reviewed emergency room notes patient was seen March 03, 2018, having tingling in his left arm and feeling numbness.  No active distress at the time he was seen in the emergency room.  It began the prior day around 5 PM while he was watching TV, possibly better with positional changes, no shortness of breath, not worse with movements or extension, no trauma falls or neck pain, no fevers or cough, he did have a mild dull headache and felt like throbbing in the left leg mild tingling in the left leg, no facial droop left side of his lip was with tingling.  Exam was unremarkable except for review of systems numbness and headaches.  Physical exam including neurological exam were normal.  EKG without acute changes.  Cardiac work-up was performed.  Negative stress test last year.  Follow-up with Dr. Alvester Chou.  Neurologic exam was normal except some paresthesias in the left arm.  MRI of the brain was ordered for CVA. No hx of seizures. He also  talks about headache behind the left eye pressure and light sensitivity.   Personally reviewed MRI of the brain images which were normal.  BUN 19, creatinine 1.26  Review of Systems: Patient complains of symptoms per HPI as well as the following symptoms: tinglig. Pertinent negatives and positives per HPI. All others negative.   Social History   Socioeconomic History  . Marital status: Married    Spouse name: Not on file  . Number of children: Not on file  . Years of education: Not on file  . Highest education level: Not on file  Occupational History  . Not on file  Social Needs  . Financial resource strain: Not on file  . Food insecurity:    Worry: Not on file    Inability: Not on file  . Transportation needs:    Medical: Not on file    Non-medical: Not on file  Tobacco Use  . Smoking status: Never Smoker  . Smokeless tobacco: Never Used  Substance and Sexual Activity  . Alcohol use: Yes    Comment: occasional  . Drug use: No  . Sexual activity: Not on file  Lifestyle  . Physical activity:    Days per week: Not on file    Minutes per session: Not on file  . Stress: Not on file  Relationships  . Social connections:    Talks on phone: Not on file    Gets together: Not on file  Attends religious service: Not on file    Active member of club or organization: Not on file    Attends meetings of clubs or organizations: Not on file    Relationship status: Not on file  . Intimate partner violence:    Fear of current or ex partner: Not on file    Emotionally abused: Not on file    Physically abused: Not on file    Forced sexual activity: Not on file  Other Topics Concern  . Not on file  Social History Narrative  . Not on file    Family History  Problem Relation Age of Onset  . Hypertension Mother   . Cancer - Lung Father   . Hypertension Sister   . Hypertension Sister   . Diabetes Mellitus II Maternal Grandmother     Past Medical History:  Diagnosis Date   . BPH (benign prostatic hyperplasia)   . Diverticulosis   . Eczema   . Hemangioma of liver   . Hepatitis C   . Hypertension   . Palpitations     Past Surgical History:  Procedure Laterality Date  . CARPAL TUNNEL RELEASE    . ROTATOR CUFF REPAIR      Current Outpatient Medications  Medication Sig Dispense Refill  . lisinopril (PRINIVIL,ZESTRIL) 20 MG tablet Take 10 mg by mouth daily.     . meloxicam (MOBIC) 15 MG tablet Take 15 mg by mouth daily as needed for pain.     . pantoprazole (PROTONIX) 40 MG tablet Take 1 tablet (40 mg total) by mouth daily. 30 tablet 11  . pravastatin (PRAVACHOL) 20 MG tablet Take 20 mg by mouth every other day.    . zolpidem (AMBIEN) 5 MG tablet Take 1 tablet (5 mg total) by mouth at bedtime as needed for sleep. 10 tablet 0  . clopidogrel (PLAVIX) 75 MG tablet Take 1 tablet (75 mg total) by mouth daily. 30 tablet 11   No current facility-administered medications for this visit.     Allergies as of 03/04/2018  . (No Known Allergies)    Vitals: BP 110/78 (BP Location: Right Arm, Patient Position: Sitting, Cuff Size: Normal)   Pulse 81   Ht 5\' 10"  (1.778 m)   Wt 202 lb 6.4 oz (91.8 kg)   BMI 29.04 kg/m  Last Weight:  Wt Readings from Last 1 Encounters:  03/04/18 202 lb 6.4 oz (91.8 kg)   Last Height:   Ht Readings from Last 1 Encounters:  03/04/18 5\' 10"  (1.778 m)   Physical exam: Exam: Gen: NAD, conversant, well nourised, obese, well groomed                     CV: RRR, no MRG. No Carotid Bruits. No peripheral edema, warm, nontender Eyes: Conjunctivae clear without exudates or hemorrhage  Neuro: Detailed Neurologic Exam  Speech:    Speech is normal; fluent and spontaneous with normal comprehension.  Cognition:    The patient is oriented to person, place, and time;     recent and remote memory intact;     language fluent;     normal attention, concentration,     fund of knowledge Cranial Nerves:    The pupils are equal, round,  and reactive to light.Attempted fundoscopy could not visualize. Visual fields are full to finger confrontation. Extraocular movements are intact. Trigeminal sensation is intact and the muscles of mastication are normal. The face is symmetric. The palate elevates in the midline. Hearing intact. Voice  is normal. Shoulder shrug is normal. The tongue has normal motion without fasciculations.   Coordination:    Normal finger to nose and heel to shin. Normal rapid alternating movements.   Gait:    Heel-toe and tandem gait are normal.   Motor Observation:    No asymmetry, no atrophy, and no involuntary movements noted. Tone:    Normal muscle tone.    Posture:    Posture is normal. normal erect    Strength:    Strength is V/V in the upper and lower limbs.      Sensation: intact to LT     Reflex Exam:  DTR's:    Deep tendon reflexes in the upper and lower extremities are symmetrical bilaterally.   Toes:    The toes are equiv bilaterally.   Clonus:    Clonus is absent.  Assessment/Plan:  62 y.o. male here as requested by ED for facial numbness. PMHx hld, HTN, CP (echo normal in 2018 per Dr. Gwenlyn Found), diabetes, hep C. Here for a second episode of head paine, left arm and leg and face tingling lasted over 24 hours with a normal MRI.  DDX: TIA, Migraine aura, concomitant CTS, also r/o sleep apnea  CTA of the head and neck to complete TIA workup Switch to plavix since can't rule out TIA  Cardiologist for cardiac eval possibly repeat echo as per cardiology recommendations EMG/NCS of bother upper extremities looking for carpal tunnel of a pinched nerve in your neck.  May be a migraine aura as he has headaches, this is a diagnosis of exclusion ESS 11, he snores, frequent awakenings - recommend sleep eval, they decline for now, discussed sequelae of untreated OSA including stroke  Orders Placed This Encounter  Procedures  . CT ANGIO HEAD W OR WO CONTRAST  . CT ANGIO NECK W OR WO CONTRAST  .  NCV with EMG(electromyography)   Meds ordered this encounter  Medications  . clopidogrel (PLAVIX) 75 MG tablet    Sig: Take 1 tablet (75 mg total) by mouth daily.    Dispense:  30 tablet    Refill:  11    I had a long d/w patient about her recent TIAs, personally independently reviewed imaging studies and stroke evaluation results and answered questions.Continue ASA for secondary stroke prevention and maintain strict control of hypertension with blood pressure goal below 130/90, diabetes with hemoglobin A1c goal below 6.5% and lipids with LDL cholesterol goal below 70 mg/dL. I also advised the patient to eat a healthy diet with plenty of whole grains, cereals, fruits and vegetables, exercise regularly and maintain ideal body weight.   [name], MD  [facility] [address] [place], [place] [address]  Phone [phone] Fax [phone]

## 2018-03-04 NOTE — Telephone Encounter (Signed)
pt scheduled at [facility] for 03/04/18

## 2018-03-04 NOTE — Telephone Encounter (Signed)
[facility] pt has [facility] faxed order they will reach out to the pt to schedule. Faxed order to [facility] with [facility] and they reach out to the pt to schedule.

## 2018-03-04 NOTE — Patient Instructions (Addendum)
DDX: TIA, Migraine aura, concomitant CTS, also r/o sleep apnea  CTA of the head and neck to complete TIA workup Switch to plavix since can't rule out TIA, stop ASA since we cannot rule out TIA Cardiologist for cardiac eval possibly repeat echo as per cardiology recommendations EMG/NCS of bother upper extremities looking for carpal tunnel of a pinched nerve in your neck.  May be a migraine aura as he has headaches, this is a diagnosis of exclusion  I had a long d/w patient about recent possilble TIAs, personally independently reviewed imaging studies and stroke evaluation results and answered questions.Continue ASA for secondary stroke prevention and maintain strict control of hypertension with blood pressure goal below 130/90, diabetes with hemoglobin A1c goal below 6.5% and lipids with LDL cholesterol goal below 70 mg/dL. I also advised the patient to eat a healthy diet with plenty of whole grains, cereals, fruits and vegetables, exercise regularly and maintain ideal body weight.

## 2018-04-10 ENCOUNTER — Ambulatory Visit (INDEPENDENT_AMBULATORY_CARE_PROVIDER_SITE_OTHER): Payer: BLUE CROSS/BLUE SHIELD | Admitting: Neurology

## 2018-04-10 ENCOUNTER — Encounter

## 2018-04-10 DIAGNOSIS — R202 Paresthesia of skin: Secondary | ICD-10-CM | POA: Diagnosis not present

## 2018-04-10 DIAGNOSIS — G5603 Carpal tunnel syndrome, bilateral upper limbs: Secondary | ICD-10-CM

## 2018-04-10 DIAGNOSIS — G5622 Lesion of ulnar nerve, left upper limb: Secondary | ICD-10-CM

## 2018-04-10 DIAGNOSIS — Z0289 Encounter for other administrative examinations: Secondary | ICD-10-CM

## 2018-04-10 NOTE — Progress Notes (Signed)
History: Remote history of CTS s/p surgery. Having tingling in the fingers  digits 4-5 on the left. He is asymptomatic in digits 1-3. Discussed with patient the changes in the median nerves on exam likely from remote injury but the left ulnar neuropathy is symptomatic. Discussed interventions, pathology and causes. He would like to try conservative measures for now.  Also discussed normal left lower extremity nerve conductions. No evidence for polyneuropathy or radiculopathy.  A total of 15 minutes was spent face-to-face with this patient. Over half this time was spent on counseling patient on the   1. Ulnar neuropathy at elbow of left upper extremity   2. Arm paresthesia, left   3. Bilateral carpal tunnel syndrome   4. Ulnar neuropathy at elbow, left   5. Left leg paresthesias     diagnosis and different diagnostic and therapeutic options, counseling and coordination of care, risks ans benefits of management, compliance, or risk factor reduction and education.

## 2018-04-10 NOTE — Patient Instructions (Addendum)
Cubital Tunnel Syndrome  Cubital tunnel syndrome is a condition that causes pain and weakness of the forearm and hand. It happens when one of the nerves that runs along the inside of the elbow joint (ulnar nerve) becomes irritated. This condition is usually caused by repeated arm motions that are done during sports or work-related activities. What are the causes? This condition may be caused by:  Increased pressure on the ulnar nerve at the elbow, arm, or forearm. This can result from: ? Irritation caused by repeated elbow bending. ? Poorly healed elbow fractures. ? Tumors in the elbow. These are usually noncancerous (benign). ? Scar tissue that develops in the elbow after an injury. ? Bony growths (spurs) near the ulnar nerve.  Stretching of the nerve due to loose elbow ligaments.  Trauma to the nerve at the elbow. What increases the risk? The following factors may make you more likely to develop this condition:  Doing manual labor that requires frequent bending of the elbow.  Playing sports that include repeated or strenuous throwing motions, such as baseball.  Playing contact sports, such as football or lacrosse.  Not warming up properly before activities.  Having diabetes.  Having an underactive thyroid (hypothyroidism). What are the signs or symptoms? Symptoms of this condition include:  Clumsiness or weakness of the hand.  Tenderness of the inner elbow.  Aching or soreness of the inner elbow, forearm, or fingers, especially the little finger or the ring finger.  Increased pain when forcing the elbow to bend.  Reduced control when throwing objects.  Tingling, numbness, or a burning feeling inside the forearm or in part of the hand or fingers, especially the little finger or the ring finger.  Sharp pains that shoot from the elbow down to the wrist and hand.  The inability to grip or pinch hard. How is this diagnosed? This condition is diagnosed based  on:  Your symptoms and medical history. Your health care provider will also ask for details about any injury.  A physical exam. You may also have tests, including:  Electromyogram (EMG). This test measures electrical signals sent by your nerves into the muscles.  Nerve conduction study. This test measures how well electrical signals pass through your nerves.  Imaging tests, such as X-rays, ultrasound, and MRI. These tests check for possible causes of your condition. How is this treated? This condition may be treated by:  Stopping the activities that are causing your symptoms to get worse.  Icing and taking medicines to reduce pain and swelling.  Wearing a splint to prevent your elbow from bending, or wearing an elbow pad where the ulnar nerve is closest to the skin.  Working with a physical therapist in less severe cases. This may help to: ? Decrease your symptoms. ? Improve the strength and range of motion of your elbow, forearm, and hand. If these treatments do not help, surgery may be needed. Follow these instructions at home: If you have a splint:  Wear the splint as told by your health care provider. Remove it only as told by your health care provider.  Loosen the splint if your fingers tingle, become numb, or turn cold and blue.  Keep the splint clean.  If the splint is not waterproof: ? Do not let it get wet. ? Cover it with a watertight covering when you take a bath or shower. Managing pain, stiffness, and swelling   If directed, put ice on the injured area: ? Put ice in a plastic  bag. ? Place a towel between your skin and the bag. ? Leave the ice on for 20 minutes, 2-3 times a day.  Move your fingers often to avoid stiffness and to lessen swelling.  Raise (elevate) the injured area above the level of your heart while you are sitting or lying down. General instructions  Take over-the-counter and prescription medicines only as told by your health care  provider.  Do any exercise or physical therapy as told by your health care provider.  Do not drive or use heavy machinery while taking prescription pain medicine.  If you were given an elbow pad, wear it as told by your health care provider.  Keep all follow-up visits as told by your health care provider. This is important. Contact a health care provider if:  Your symptoms get worse.  Your symptoms do not get better with treatment.  You have new pain.  Your hand on the injured side feels numb or cold. Summary  Cubital tunnel syndrome is a condition that causes pain and weakness of the forearm and hand.  You are more likely to develop this condition if you do work or play sports that involve repeated arm movements.  This condition is often treated by stopping repetitive activities, applying ice, and using anti-inflammatory medicines.  In rare cases, surgery may be needed. This information is not intended to replace advice given to you by your health care provider. Make sure you discuss any questions you have with your health care provider. Document Released: 04/09/2005 Document Revised: 08/26/2017 Document Reviewed: 08/26/2017 Elsevier Interactive Patient Education  2019 [employer]   Ulnar Nerve Transposition  Ulnar nerve transposition is a surgery to move the ulnar nerve. The surgery is done to treat a condition in which the ulnar nerve gets squeezed (compressed) behind the elbow (cubital tunnel syndrome). You may need this surgery if other treatments for cubital tunnel syndrome have not worked. The ulnar nerve starts in the neck, passes behind the elbow, and travels down to the hand. During an ulnar nerve transposition, your surgeon will move your ulnar nerve to the inside of the arm, so that the nerve passes in front of your elbow instead of behind your elbow. Tell a health care provider about:  Any allergies you have.  All medicines you are taking, including vitamins,  herbs, eye drops, creams, and over-the-counter medicines.  Any problems you or family members have had with anesthetic medicines.  Any blood disorders you have.  Any surgeries you have had.  Any medical conditions you have.  Whether you are pregnant or may be pregnant. What are the risks? Generally, this is a safe procedure. However, problems may occur, including:  Nerve damage, which can cause tingling, numbness, or weakness.  Persistent pain after surgery.  Bleeding.  Infection.  Damage to blood vessels.  Allergic reactions to anesthetics. What happens before the procedure?  Follow instructions from your health care provider about eating or drinking restrictions.  Ask your health care provider about: ? Changing or stopping your regular medicines. This is especially important if you are taking diabetes medicines or blood thinners. ? Taking medicines such as aspirin and ibuprofen. These medicines can thin your blood. Do not take these medicines before your procedure if your health care provider instructs you not to.  Plan to have someone take you home after the procedure.  If you go home right after the procedure, plan to have someone stay with you for 24 hours. What happens during the procedure?  To reduce your risk of infection: ? Your health care team will wash or sanitize their hands. ? Your skin will be washed with soap.  An IV tube will be inserted into one of your veins.  You will be given one or more of the following: ? A medicine to help you relax (sedative). ? A medicine to make you fall asleep (general anesthetic). ? A medicine that is injected into an area above your elbow to numb everything beyond the injection site (regional anesthetic).  After the anesthesia starts to work, Engineer, production will make a surgical cut (incision) along the inside of your elbow.  The muscles beneath the skin will be moved out of the way to reveal the ulnar nerve.  The  nerve will be lifted out of place and repositioned in front of your elbow. Your surgeon may place the nerve above or below the muscle layer.  The incision will be closed with stitches (sutures), staples, or surgical glue.  A bandage (dressing) will be placed over the incision. The procedure may vary among health care providers and hospitals. What happens after the procedure?  Your blood pressure, heart rate, breathing rate, and blood oxygen level will be monitored often until the medicines you were given have worn off.  You will be given medicine to control pain.  You may have to wear a sling to protect your arm and prevent it from moving while you heal.  Do not drive for 24 hours if you received a sedative. This information is not intended to replace advice given to you by your health care provider. Make sure you discuss any questions you have with your health care provider. Document Released: 05/06/2015 Document Revised: 03/28/2017 Document Reviewed: 02/03/2015 Elsevier Interactive Patient Education  2019 [employer].

## 2018-04-16 NOTE — Progress Notes (Signed)
See procedure note.

## 2018-04-16 NOTE — Progress Notes (Signed)
Full Name: Melvin Fisher Gender: Male MRN #: [id number] Date of Birth: 2055-11-01    Visit Date: 04/10/18 08:07 Age: 62 Years 82 Months Old Examining Physician: [name], MD  Primary Care Physician:  [name], MD  History: 62 year old patient with tingling in the left fingers, the left hand especially at night if laying on it.  He has a remote history of carpal tunnel syndrome bilaterally.  Current symptoms are in the left ulnar distribution. Also paresthesias and numbness in the left lower extremity.  Summary:   Nerve Conduction Studies were performed on the bilateral upper extremities and left lower extremity.  The right median APB motor nerve showed prolonged distal onset latency (4.5 ms, N<4.4) The left  median APB motor nerve showed prolonged distal onset latency (4.7 ms, N<4.4)  The left Ulnar ADM motor nerve showed prolonged distal onset latency (3.7 ms, N<3.3) and a 47m/s drop in conduction velocity across the left elbow. The right Median 2nd Digit orthodromic sensory nerve showed prolonged distal peak latency (3.91ms, N<3.4) and reduced amplitude(8 uV, N>10) The left  Median 2nd Digit orthodromic sensory nerve showed prolonged distal peak latency (3.52ms, N<3.4) and reduced amplitude(9uV, N>10) The left  Ulnar 5th Digit orthodromic sensory nerve showed prolonged distal peak latency (3.90ms, N<3.1)  The left median/radial  (thumb) comparison nerve showed prolonged distal peak latency (Median thumb, 4.0 ms) and abnormal peak latency difference (Median Palm-Ulnar Palm, 0.7 ms, N<0.5) with a relative median delay.    The left median/ulnar (palm) comparison nerve showed prolonged distal peak latency (Median Palm, 2.7 ms, N<2.2) and abnormal peak latency difference (Median Palm-Ulnar Palm, 0.5 ms, N<0.4) with a relative median delay.    All remaining nerves (as detailed in the following tables) were within normal limits.  All muscles (as detailed in the following  tables) were within normal limits.    Conclusion: This is an abnormal study: 1. 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 Patient would like to treat conservatively at this timw. 2. There is electrophysiologic evidence of bilateral moderately-severe Carpal Tunnel Syndrome. This may be remote as patient has a history of Carpal Tunnel Syndrome (CTS) and he is not symptomatic of CTS at this time. 3. Nerve Conductions of the left lower extremity were normal 3. No suggestion of polyneuropathy or cervical radiculopathy.  [name] M.D.  [facility] [place], [place] [address] Tel: [phone] Fax: [phone]        [facility]    Nerve / Sites Muscle Latency Ref. Amplitude Ref. Rel Amp Segments Distance Velocity Ref. Area    ms ms mV mV %  cm m/s m/s mVms  L Median - APB     Wrist APB 4.7 ?4.4 6.3 ?4.0 100 Wrist - APB 7   24.9     Upper arm APB 9.2  5.4  85.9 Upper arm - Wrist 24 54 ?49 19.5  R Median - APB     Wrist APB 4.5 ?4.4 8.9 ?4.0 100 Wrist - APB 7   22.8     Upper arm APB 9.5  7.4  83.9 Upper arm - Wrist 25 49 ?49 18.8  L Ulnar - ADM     Wrist ADM 3.7 ?3.3 10.6 ?6.0 100 Wrist - ADM 7   42.5     B.Elbow ADM 8.0  9.2  86.3 B.Elbow - Wrist 21 49 ?49 41.1     A.Elbow ADM 10.5  8.4  91.4 A.Elbow -  B.Elbow 10 39 ?49 40.1         A.Elbow - Wrist      R Ulnar - ADM     Wrist ADM 2.8 ?3.3 8.7 ?6.0 100 Wrist - ADM 7   32.1     B.Elbow ADM 6.6  8.3  95.1 B.Elbow - Wrist 21 54 ?49 31.8     A.Elbow ADM 8.5  8.1  97.3 A.Elbow - B.Elbow 10 53 ?49 34.2         A.Elbow - Wrist      L Peroneal - EDB     Ankle EDB 5.9 ?6.5 2.9 ?2.0 100 Ankle - EDB 9   7.9     Fib head EDB 13.4  2.6  89.9 Fib head - Ankle 33 44 ?44 7.7     Pop fossa EDB 15.5  2.8  107 Pop fossa - Fib head 10 48 ?44 8.9         Pop fossa - Ankle      L Tibial - AH     Ankle AH 5.1 ?5.8 10.6 ?4.0 100 Ankle - AH 9   30.3     Pop fossa AH 15.2  9.6  91 Pop fossa -  Ankle 42 42 ?41 25.7                  SNC    Nerve / Sites Rec. Site Peak Lat Ref.  Amp Ref. Segments Distance Peak Diff Ref.    ms ms V V  cm ms ms  L Radial - Anatomical snuff box (Forearm)     Forearm Wrist 2.4 ?2.9 17 ?15 Forearm - Wrist 10    L Sural - Ankle (Calf)     Calf Ankle 3.2 ?4.4 17 ?6 Calf - Ankle 14    L Superficial peroneal - Ankle     Lat leg Ankle 3.8 ?4.4 10 ?6 Lat leg - Ankle 14    L Median, Ulnar - Transcarpal comparison     Median Palm Wrist 2.7 ?2.2 27 ?35 Median Palm - Wrist 8       Ulnar Palm Wrist 2.2 ?2.2 25 ?12 Ulnar Palm - Wrist 8          Median Palm - Ulnar Palm  0.5 ?0.4  L Median, Radial - Thumb comparison     Median Wrist Thumb 4.0  22  Median Wrist - Thumb 10       Radial Wrist Thumb 3.3  7  Radial Wrist - Thumb 10          Median Wrist - Radial Wrist  0.7 ?0.5  L Median - Orthodromic (Dig II, Mid palm)     Dig II Wrist 3.9 ?3.4 9 ?10 Dig II - Wrist 13    R Median - Orthodromic (Dig II, Mid palm)     Dig II Wrist 3.9 ?3.4 8 ?10 Dig II - Wrist 13    L Ulnar - Orthodromic, (Dig V, Mid palm)     Dig V Wrist 3.5 ?3.1 12 ?5 Dig V - Wrist 11    R Ulnar - Orthodromic, (Dig V, Mid palm)     Dig V Wrist 2.8 ?3.1 6 ?5 Dig V - Wrist 8                         F  Wave    Nerve F Lat Ref.   ms ms  L Ulnar -  ADM 32.0 ?32.0  L Tibial - AH 51.9 ?56.0  R Ulnar - ADM 30.1 ?32.0           EMG full       EMG Summary Table    Spontaneous MUAP Recruitment  Muscle IA Fib PSW Fasc Other Amp Dur. Poly Pattern  L. Cervical paraspinals (low) Normal None None None _______ Normal Normal Normal Normal  R. Cervical paraspinals (low) Normal None None None _______ Normal Normal Normal Normal  L. Deltoid Normal None None None _______ Normal Normal Normal Normal  R. Deltoid Normal None None None _______ Normal Normal Normal Normal  L. Triceps brachii Normal None None None _______ Normal Normal Normal Normal  R. Triceps brachii Normal None None None _______ Normal  Normal Normal Normal  Bilateral First dorsal interosseous Normal None None None _______ Normal Normal Normal Normal  Bilateral Flexor Dig Prof Normal None None None _______ Normal Normal Normal Normal  L. Opponens pollicis Normal None None None _______ Normal Normal Normal Normal  R. Opponens pollicis Normal None None None _______ Normal Normal Normal Normal  L. Pronator teres Normal None None None _______ Normal Normal Normal Normal  R. Pronator teres Normal None None None _______ Normal Normal Normal Normal

## 2018-04-29 DIAGNOSIS — G5600 Carpal tunnel syndrome, unspecified upper limb: Secondary | ICD-10-CM | POA: Insufficient documentation

## 2018-04-29 DIAGNOSIS — G5622 Lesion of ulnar nerve, left upper limb: Secondary | ICD-10-CM | POA: Insufficient documentation

## 2018-04-29 NOTE — Procedures (Signed)
Full Name: Melvin Fisher Gender: Male MRN #: [id number] Date of Birth: 11/02/2055    Visit Date: 04/10/18 08:07 Age: 63 Years 51 Months Old Examining Physician: [name], MD  Primary Care Physician:  [name], MD  History: 63 year old patient with tingling in the left fingers, the left hand especially at night if laying on it.  He has a remote history of carpal tunnel syndrome bilaterally.  Current symptoms are in the left ulnar distribution. Also paresthesias and numbness in the left lower extremity.  Summary:   Nerve Conduction Studies were performed on the bilateral upper extremities and left lower extremity.  The right median APB motor nerve showed prolonged distal onset latency (4.5 ms, N<4.4) The left  median APB motor nerve showed prolonged distal onset latency (4.7 ms, N<4.4)  The left Ulnar ADM motor nerve showed prolonged distal onset latency (3.7 ms, N<3.3) and a 24m/s drop in conduction velocity across the left elbow. The right Median 2nd Digit orthodromic sensory nerve showed prolonged distal peak latency (3.15ms, N<3.4) and reduced amplitude(8 uV, N>10) The left  Median 2nd Digit orthodromic sensory nerve showed prolonged distal peak latency (3.8ms, N<3.4) and reduced amplitude(9uV, N>10) The left  Ulnar 5th Digit orthodromic sensory nerve showed prolonged distal peak latency (3.1ms, N<3.1)  The left median/radial  (thumb) comparison nerve showed prolonged distal peak latency (Median thumb, 4.0 ms) and abnormal peak latency difference (Median Palm-Ulnar Palm, 0.7 ms, N<0.5) with a relative median delay.    The left median/ulnar (palm) comparison nerve showed prolonged distal peak latency (Median Palm, 2.7 ms, N<2.2) and abnormal peak latency difference (Median Palm-Ulnar Palm, 0.5 ms, N<0.4) with a relative median delay.    All remaining nerves (as detailed in the following tables) were within normal limits.  All muscles (as detailed in the following  tables) were within normal limits.    Conclusion: This is an abnormal study: 1. 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 Patient would like to treat conservatively at this timw. 2. There is electrophysiologic evidence of bilateral moderately-severe Carpal Tunnel Syndrome. This may be remote as patient has a history of Carpal Tunnel Syndrome (CTS) and he is not symptomatic of CTS at this time. 3. Nerve Conductions of the left lower extremity were normal 3. No suggestion of polyneuropathy or cervical radiculopathy.  [name] M.D.  [facility] [place], [place] [address] Tel: [phone] Fax: [phone]        [facility]    Nerve / Sites Muscle Latency Ref. Amplitude Ref. Rel Amp Segments Distance Velocity Ref. Area    ms ms mV mV %  cm m/s m/s mVms  L Median - APB     Wrist APB 4.7 ?4.4 6.3 ?4.0 100 Wrist - APB 7   24.9     Upper arm APB 9.2  5.4  85.9 Upper arm - Wrist 24 54 ?49 19.5  R Median - APB     Wrist APB 4.5 ?4.4 8.9 ?4.0 100 Wrist - APB 7   22.8     Upper arm APB 9.5  7.4  83.9 Upper arm - Wrist 25 49 ?49 18.8  L Ulnar - ADM     Wrist ADM 3.7 ?3.3 10.6 ?6.0 100 Wrist - ADM 7   42.5     B.Elbow ADM 8.0  9.2  86.3 B.Elbow - Wrist 21 49 ?49 41.1     A.Elbow ADM 10.5  8.4  91.4 A.Elbow -  B.Elbow 10 39 ?49 40.1         A.Elbow - Wrist      R Ulnar - ADM     Wrist ADM 2.8 ?3.3 8.7 ?6.0 100 Wrist - ADM 7   32.1     B.Elbow ADM 6.6  8.3  95.1 B.Elbow - Wrist 21 54 ?49 31.8     A.Elbow ADM 8.5  8.1  97.3 A.Elbow - B.Elbow 10 53 ?49 34.2         A.Elbow - Wrist      L Peroneal - EDB     Ankle EDB 5.9 ?6.5 2.9 ?2.0 100 Ankle - EDB 9   7.9     Fib head EDB 13.4  2.6  89.9 Fib head - Ankle 33 44 ?44 7.7     Pop fossa EDB 15.5  2.8  107 Pop fossa - Fib head 10 48 ?44 8.9         Pop fossa - Ankle      L Tibial - AH     Ankle AH 5.1 ?5.8 10.6 ?4.0 100 Ankle - AH 9   30.3     Pop fossa AH 15.2  9.6  91 Pop fossa -  Ankle 42 42 ?41 25.7                  SNC    Nerve / Sites Rec. Site Peak Lat Ref.  Amp Ref. Segments Distance Peak Diff Ref.    ms ms V V  cm ms ms  L Radial - Anatomical snuff box (Forearm)     Forearm Wrist 2.4 ?2.9 17 ?15 Forearm - Wrist 10    L Sural - Ankle (Calf)     Calf Ankle 3.2 ?4.4 17 ?6 Calf - Ankle 14    L Superficial peroneal - Ankle     Lat leg Ankle 3.8 ?4.4 10 ?6 Lat leg - Ankle 14    L Median, Ulnar - Transcarpal comparison     Median Palm Wrist 2.7 ?2.2 27 ?35 Median Palm - Wrist 8       Ulnar Palm Wrist 2.2 ?2.2 25 ?12 Ulnar Palm - Wrist 8          Median Palm - Ulnar Palm  0.5 ?0.4  L Median, Radial - Thumb comparison     Median Wrist Thumb 4.0  22  Median Wrist - Thumb 10       Radial Wrist Thumb 3.3  7  Radial Wrist - Thumb 10          Median Wrist - Radial Wrist  0.7 ?0.5  L Median - Orthodromic (Dig II, Mid palm)     Dig II Wrist 3.9 ?3.4 9 ?10 Dig II - Wrist 13    R Median - Orthodromic (Dig II, Mid palm)     Dig II Wrist 3.9 ?3.4 8 ?10 Dig II - Wrist 13    L Ulnar - Orthodromic, (Dig V, Mid palm)     Dig V Wrist 3.5 ?3.1 12 ?5 Dig V - Wrist 11    R Ulnar - Orthodromic, (Dig V, Mid palm)     Dig V Wrist 2.8 ?3.1 6 ?5 Dig V - Wrist 79                         F  Wave    Nerve F Lat Ref.   ms ms  L Ulnar -  ADM 32.0 ?32.0  L Tibial - AH 51.9 ?56.0  R Ulnar - ADM 30.1 ?32.0           EMG full       EMG Summary Table    Spontaneous MUAP Recruitment  Muscle IA Fib PSW Fasc Other Amp Dur. Poly Pattern  L. Cervical paraspinals (low) Normal None None None _______ Normal Normal Normal Normal  R. Cervical paraspinals (low) Normal None None None _______ Normal Normal Normal Normal  L. Deltoid Normal None None None _______ Normal Normal Normal Normal  R. Deltoid Normal None None None _______ Normal Normal Normal Normal  L. Triceps brachii Normal None None None _______ Normal Normal Normal Normal  R. Triceps brachii Normal None None None _______ Normal  Normal Normal Normal  Bilateral First dorsal interosseous Normal None None None _______ Normal Normal Normal Normal  Bilateral Flexor Dig Prof Normal None None None _______ Normal Normal Normal Normal  L. Opponens pollicis Normal None None None _______ Normal Normal Normal Normal  R. Opponens pollicis Normal None None None _______ Normal Normal Normal Normal  L. Pronator teres Normal None None None _______ Normal Normal Normal Normal  R. Pronator teres Normal None None None _______ Normal Normal Normal Normal

## 2018-08-28 ENCOUNTER — Telehealth: Payer: Self-pay

## 2018-08-28 NOTE — Progress Notes (Signed)
I called pt that his visit on Sep 02, 2018 will be change to video visit due to COVID 19. He gave verbal consent to do video and to file his insurance. I stated we are not doing in office visits at this time. I updated pts chart, meds and pharmacy. Pt stated he is not taking plavix and never started the medication. I explain he will receive a text message to his cell phone 5 to 10 minutes prior to visit.Click thelink 3-83 minutes before your scheduled appt time, enter your First and Last name, and the doctor will be with you shortly.

## 2018-08-28 NOTE — Telephone Encounter (Signed)
I call pt that his visit will be change to video due to COVID 19. I receive verbal consent to do video and to file insurance. I stated it will not be a in office visit. I updated chart and pt is not on plavix. He never started medication and was advise by PCP to not take it.I stated provider will text him a message with a link to click on. He will click the link 6-06 minutes before your scheduled appt time, enter your First and Last name, and the doctor will be with you shortly. He verbalized understanding.

## 2018-09-02 ENCOUNTER — Encounter: Payer: Self-pay | Admitting: Adult Health

## 2018-09-02 ENCOUNTER — Ambulatory Visit (INDEPENDENT_AMBULATORY_CARE_PROVIDER_SITE_OTHER): Payer: BLUE CROSS/BLUE SHIELD | Admitting: Adult Health

## 2018-09-02 ENCOUNTER — Other Ambulatory Visit: Payer: Self-pay

## 2018-09-02 DIAGNOSIS — I10 Essential (primary) hypertension: Secondary | ICD-10-CM

## 2018-09-02 DIAGNOSIS — E785 Hyperlipidemia, unspecified: Secondary | ICD-10-CM

## 2018-09-02 DIAGNOSIS — G5622 Lesion of ulnar nerve, left upper limb: Secondary | ICD-10-CM

## 2018-09-02 DIAGNOSIS — R202 Paresthesia of skin: Secondary | ICD-10-CM | POA: Diagnosis not present

## 2018-09-02 DIAGNOSIS — G5603 Carpal tunnel syndrome, bilateral upper limbs: Secondary | ICD-10-CM

## 2018-09-02 NOTE — Progress Notes (Signed)
[facility] [address] [place]. Melvin Fisher [phone]       VIRTUAL VISIT FOLLOW UP NOTE  [name] Date of Birth:  05-Dec-1955 Medical Record Number:  [id number]   Reason for visit: Facial numbness    Virtual Visit via Video Note  I connected with Melvin Fisher on 09/02/18 at  7:45 AM EDT by a video enabled telemedicine application located remotely in my own home and verified that I am speaking with the correct person using two identifiers who was located at their own home.   Visit scheduled by Katharine Look, RN. She discussed the limitations of evaluation and management by telemedicine and the availability of in person appointments. The patient expressed understanding and agreed to proceed.Please see telephone note for additional scheduling information and consent.     HPI: Melvin Fisher was initially scheduled today for in office hospital follow-up regarding transient episode of facial numbness but due to COVID-19 safety precautions, visit transition to telemedicine via [email or web address] with patients consent.   He was initially evaluated by Dr. Jaynee Eagles on 03/04/2018 after he presented to ED with left facial, arm and leg numbness lasting over 24 hours with negative MRI.  Differential diagnosis include TIA, migraine with aura or contaminant CTS.  He has not experienced any additional numbness/tingling episodes since that time.  Dr. Jaynee Eagles  recommended EMG/NCS for further evaluation which was completed on 04/10/2018.  This showed symptomatic moderately-severe left ulnar neuropathy at the elbow and bilateral asymptomatic moderately to severe carpal tunnel syndrome.  Patient elected to treat conservatively at that time.  Due to possible TIA, recommended initiating Plavix 75 mg daily as he was previously on aspirin 81 mg daily.  Per patient, he continues on aspirin 81 mg daily as his PCP did not advise changing to Plavix at that time.  Continues on  pravastatin 20 mg QOD with stable HDL levels.  He does not routinely monitor BP at home but plans on obtaining BP cuff in order to monitor.  He has increased his activity level and exercising daily along with eating healthy.  Denies any recent migraines/headaches or any other neurological symptoms.  He states he overall feels "pretty well" and has no concerns today.    ROS:   14 system review of systems performed and negative with exception of no complaints  PMH:  Past Medical History:  Diagnosis Date   BPH (benign prostatic hyperplasia)    Diverticulosis    Eczema    Hemangioma of liver    Hepatitis C    Hypertension    Palpitations     PSH:  Past Surgical History:  Procedure Laterality Date   CARPAL TUNNEL RELEASE     ROTATOR CUFF REPAIR      Social History:  Social History   Socioeconomic History   Marital status: Married    Spouse name: Not on file   Number of children: Not on file   Years of education: Not on file   Highest education level: Not on file  Occupational History   Not on file  Social Needs   Financial resource strain: Not on file   Food insecurity:    Worry: Not on file    Inability: Not on file   Transportation needs:    Medical: Not on file    Non-medical: Not on file  Tobacco Use   Smoking status: Never Smoker   Smokeless tobacco: Never Used  Substance and Sexual Activity   Alcohol use:  Yes    Comment: occasional   Drug use: No   Sexual activity: Not on file  Lifestyle   Physical activity:    Days per week: Not on file    Minutes per session: Not on file   Stress: Not on file  Relationships   Social connections:    Talks on phone: Not on file    Gets together: Not on file    Attends religious service: Not on file    Active member of club or organization: Not on file    Attends meetings of clubs or organizations: Not on file    Relationship status: Not on file   Intimate partner violence:    Fear of  current or ex partner: Not on file    Emotionally abused: Not on file    Physically abused: Not on file    Forced sexual activity: Not on file  Other Topics Concern   Not on file  Social History Narrative   Not on file    Family History:  Family History  Problem Relation Age of Onset   Hypertension Mother    Cancer - Lung Father    Hypertension Sister    Hypertension Sister    Diabetes Mellitus II Maternal Grandmother     Medications:   Current Outpatient Medications on File Prior to Visit  Medication Sig Dispense Refill   lisinopril (PRINIVIL,ZESTRIL) 20 MG tablet Take 10 mg by mouth daily.      meloxicam (MOBIC) 15 MG tablet Take 15 mg by mouth daily as needed for pain.      pravastatin (PRAVACHOL) 20 MG tablet Take 20 mg by mouth every other day.     zolpidem (AMBIEN) 5 MG tablet Take 1 tablet (5 mg total) by mouth at bedtime as needed for sleep. 10 tablet 0   No current facility-administered medications on file prior to visit.     Allergies:  No Known Allergies   Physical Exam  General: well developed, well nourished, pleasant middle-aged African-American male, seated, in no evident distress Head: head normocephalic and atraumatic.     Neurologic Exam Mental Status: Awake and fully alert. Oriented to place and time. Recent and remote memory intact. Attention span, concentration and fund of knowledge appropriate. Mood and affect appropriate.  Cranial Nerves:  Extraocular movements full without nystagmus. Hearing intact to voice. Facial sensation intact. Face, tongue, palate moves normally and symmetrically.  Shoulder shrug symmetric. Motor: No evidence of weakness per drift assessment Sensory.: intact to light touch Coordination: Rapid alternating movements normal in all extremities. Finger-to-nose and heel-to-shin performed accurately bilaterally. Gait and Station: Arises from chair without difficulty. Stance is normal. Gait demonstrates normal stride  length and balance. Able to heel, toe and tandem walk without difficulty.  Reflexes: UTA   Diagnostic Data (Labs, Imaging, Testing)  NCV with EMG 04/10/2018 Conclusion: This is an abnormal study: 1. 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 Patient would like to treat conservatively at this timw. 2. There is electrophysiologic evidence of bilateral moderately-severe Carpal Tunnel Syndrome. This may be remote as patient has a history of Carpal Tunnel Syndrome (CTS) and he is not symptomatic of CTS at this time. 3. Nerve Conductions of the left lower extremity were normal 3. No suggestion of polyneuropathy or cervical radiculopathy.    ASSESSMENT: GERMAINE SHENKER is a 63 y.o. year old male with underlying medical history of HLD, HTN, CP, DM and hep C.  He was initially evaluated in our  office by Dr. Jaynee Eagles in 02/2018 after second episode of facial numbness, left arm and leg numbness lasting over 24 hours with normal MRI with differential diagnosis TIA, migraine aura or with concomitant CTS. he denies any recurrent numbness/tingling episodes.  EMG/NCS 04/10/2018 showed symptomatic moderately severe left ulnar neuropathy at the elbow and asymptomatic bilateral moderately-severe carpal tunnel syndrome.    PLAN:  Continue current treatment plan as he has been stable without any recurrent episodes Continue aspirin 81 mg and pravastatin 20 mg -discussion with patient regarding reasoning behind recommendation of Plavix as the symptoms potentially could be related to stroke/TIA.  He elects to continue to follow with his PCP in regards to potential future Plavix use and ongoing pravastatin use Obtain BP cuff at home for ongoing BP monitoring No recent symptoms of left ulnar neuropathy and would like to continue to treat conservatively as needed Recommended CTA head/neck by Dr. Jaynee Eagles at prior visit not completed -we will follow-up in regards to need of imaging at  this time Advised to notify us if he starts to experience frequent migraines or worsening neuropathy/carpal tunnel pain Continues to decline OSA evaluation -education provided in regards to untreated OSA Advised to call 911 for any additional symptoms stroke/TIA symptoms    Follow-up as needed   Greater than 50% of time during this 25 minute non-face-to-face visit was spent on counseling, explanation of diagnosis of potential TIA, ulnar neuropathy, CTS and migraine with aura, reviewing stroke risk factor management of HTN and HLD, planning of further management along with potential future management, and discussion with patient and family answering all questions.    Venancio Poisson, AGNP-BC  [facility] [address] [place], [place] [address]  Phone [phone] Fax [phone] Note: This document was prepared with digital dictation and possible smart phrase technology. Any transcriptional errors that result from this process are unintentional.

## 2019-02-26 ENCOUNTER — Other Ambulatory Visit: Payer: Self-pay

## 2019-02-26 ENCOUNTER — Encounter (HOSPITAL_COMMUNITY): Payer: Self-pay

## 2019-02-26 ENCOUNTER — Emergency Department (HOSPITAL_COMMUNITY): Payer: BC Managed Care – PPO

## 2019-02-26 ENCOUNTER — Emergency Department (HOSPITAL_COMMUNITY)
Admission: EM | Admit: 2019-02-26 | Discharge: 2019-02-27 | Disposition: A | Discharge status: Home / Self Care | Payer: BC Managed Care – PPO | Attending: Emergency Medicine | Admitting: Emergency Medicine

## 2019-02-26 DIAGNOSIS — R202 Paresthesia of skin: Secondary | ICD-10-CM | POA: Diagnosis present

## 2019-02-26 DIAGNOSIS — Z7982 Long term (current) use of aspirin: Secondary | ICD-10-CM | POA: Insufficient documentation

## 2019-02-26 DIAGNOSIS — Z79899 Other long term (current) drug therapy: Secondary | ICD-10-CM | POA: Diagnosis not present

## 2019-02-26 DIAGNOSIS — G629 Polyneuropathy, unspecified: Secondary | ICD-10-CM | POA: Diagnosis not present

## 2019-02-26 DIAGNOSIS — I10 Essential (primary) hypertension: Secondary | ICD-10-CM | POA: Diagnosis not present

## 2019-02-26 DIAGNOSIS — R200 Anesthesia of skin: Secondary | ICD-10-CM

## 2019-02-26 LAB — CBC WITH DIFFERENTIAL/PLATELET
Abs Immature Granulocytes: 0.04 10*3/uL (ref 0.00–0.07)
Basophils Absolute: 0 10*3/uL (ref 0.0–0.1)
Basophils Relative: 1 %
Eosinophils Absolute: 0.2 10*3/uL (ref 0.0–0.5)
Eosinophils Relative: 3 %
HCT: 46 % (ref 39.0–52.0)
Hemoglobin: 15.1 g/dL (ref 13.0–17.0)
Immature Granulocytes: 1 %
Lymphocytes Relative: 22 %
Lymphs Abs: 1.6 10*3/uL (ref 0.7–4.0)
MCH: 29.2 pg (ref 26.0–34.0)
MCHC: 32.8 g/dL (ref 30.0–36.0)
MCV: 89 fL (ref 80.0–100.0)
Monocytes Absolute: 0.6 10*3/uL (ref 0.1–1.0)
Monocytes Relative: 8 %
Neutro Abs: 4.6 10*3/uL (ref 1.7–7.7)
Neutrophils Relative %: 65 %
Platelets: 167 10*3/uL (ref 150–400)
RBC: 5.17 MIL/uL (ref 4.22–5.81)
RDW: 12 % (ref 11.5–15.5)
WBC: 6.9 10*3/uL (ref 4.0–10.5)
nRBC: 0 % (ref 0.0–0.2)

## 2019-02-26 LAB — BASIC METABOLIC PANEL
Anion gap: 8 (ref 5–15)
BUN: 18 mg/dL (ref 8–23)
CO2: 26 mmol/L (ref 22–32)
Calcium: 9.2 mg/dL (ref 8.9–10.3)
Chloride: 103 mmol/L (ref 98–111)
Creatinine, Ser: 1.28 mg/dL — ABNORMAL HIGH (ref 0.61–1.24)
GFR calc Af Amer: >60 mL/min (ref >60)
GFR calc non Af Amer: 59 mL/min — ABNORMAL LOW (ref >60)
Glucose, Bld: 113 mg/dL — ABNORMAL HIGH (ref 70–99)
Potassium: 3.4 mmol/L — ABNORMAL LOW (ref 3.5–5.1)
Sodium: 137 mmol/L (ref 135–145)

## 2019-02-26 MED ORDER — LORAZEPAM 1 MG PO TABS
1.0000 mg | ORAL_TABLET | Freq: Four times a day (QID) | ORAL | Status: DC | PRN
  Administered 2019-02-26: 1 mg via ORAL
  Filled 2019-02-26: qty 1

## 2019-02-26 NOTE — ED Provider Notes (Signed)
[facility] DEPT Provider Note   CSN: [id number] Arrival date & time: 02/26/19  1640     History   Chief Complaint Chief Complaint  Patient presents with  . Numbness    HPI Melvin Fisher is a 63 y.o. male.HLD, HTN, CP, DM and hep C.  Presents emerged department with a chief complaint follow-up on left arm, left leg numbness.  Patient reports that woke up with symptoms, states he has a tingling sensation on his left arm and left leg.  Has been relatively constant throughout the day.  No difficulty walking, no dizziness, no vision changes, no weakness.  No recent trauma.  Patient states episode today is nearly identical to his episode last November.  Stroke work-up at that time was negative.  He was referred to outpatient neurology where they performed further testing concerning for an abnormal EMG, ulnar neuropathy, elected for conservative management at that time.   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 BPH (benign prostatic hyperplasia)   . Diverticulosis   . Eczema   . Hemangioma of liver   . Hepatitis C   . Hypertension   . Palpitations     Patient Active Problem List   Diagnosis Date Noted  . CTS (carpal tunnel syndrome) 04/29/2018  . Ulnar neuropathy at elbow, left 04/29/2018  . Left face and left arm tingling 03/04/2018  . Shortness of breath 09/13/2016  . Atypical chest pain 09/13/2016  . Essential hypertension 09/13/2016  . Hyperlipidemia 09/13/2016    Past Surgical History:  Procedure Laterality Date  . CARPAL TUNNEL RELEASE    . ROTATOR CUFF REPAIR          Home Medications    Prior to Admission medications   Medication Sig Start Date End Date Taking? Authorizing Provider  aspirin EC 81 MG tablet Take 81 mg by mouth daily.    [provider]  lisinopril (PRINIVIL,ZESTRIL) 20 MG tablet Take 10 mg by mouth daily.     [provider]  meloxicam (MOBIC) 15 MG tablet Take 15 mg by mouth daily as needed for pain.  11/26/17   [provider]  pravastatin (PRAVACHOL) 20 MG tablet Take 20 mg by mouth every other day.    [provider]  zolpidem (AMBIEN) 5 MG tablet Take 1 tablet (5 mg total) by mouth at bedtime as needed for sleep. 03/03/18   Veryl Speak, MD    Family History Family History  Problem Relation Age of Onset  . Hypertension Mother   . Cancer - Lung Father   . Hypertension Sister   . Hypertension Sister   . Diabetes Mellitus II Maternal Grandmother     Social History Social History   Tobacco Use  . Smoking status: Never Smoker  . Smokeless tobacco: Never Used  Substance Use Topics  . Alcohol use: Yes    Comment: occasional  . Drug use: No     Allergies   Patient has no known allergies.   Review of Systems Review of Systems  Constitutional: Negative for chills and fever.  HENT: Negative for ear pain and sore throat.   Eyes: Negative for pain and visual disturbance.  Respiratory: Negative for cough and shortness of breath.   Cardiovascular: Negative for chest pain and palpitations.  Gastrointestinal: Negative for abdominal pain and vomiting.  Genitourinary:  Negative for dysuria and hematuria.  Musculoskeletal: Negative for arthralgias and back pain.  Skin: Negative for color change and rash.  Neurological: Positive for numbness. Negative for seizures and syncope.  All other systems reviewed and are negative.    Physical Exam Updated Vital Signs BP 124/88   Pulse (!) 58   Temp 99 F (37.2 C) (Oral)   Resp 20   SpO2 96%   Physical Exam Vitals signs and nursing note reviewed.  Constitutional:      Appearance: He is well-developed.  HENT:     Head: Normocephalic and atraumatic.  Eyes:     Conjunctiva/sclera: Conjunctivae normal.  Neck:      Musculoskeletal: Neck supple.  Cardiovascular:     Rate and Rhythm: Normal rate and regular rhythm.     Heart sounds: No murmur.  Pulmonary:     Effort: Pulmonary effort is normal. No respiratory distress.     Breath sounds: Normal breath sounds.  Abdominal:     Palpations: Abdomen is soft.     Tenderness: There is no abdominal tenderness.  Skin:    General: Skin is warm and dry.  Neurological:     General: No focal deficit present.     Mental Status: He is alert and oriented to person, place, and time.     Comments: Cranial nerves II through XII intact, normal visual fields, normal speech, normal finger-nose-finger, 5 out of 5 strength in bilateral upper and lower extremities, sensation to light touch intact in all 4 extremities, no focal sensory deficits identified  Psychiatric:        Mood and Affect: Mood normal.        Behavior: Behavior normal.      ED Treatments / Results  Labs (all labs ordered are listed, but only abnormal results are displayed) Labs Reviewed  BASIC METABOLIC PANEL - Abnormal; Notable for the following components:      Result Value   Potassium 3.4 (*)    Glucose, Bld 113 (*)    Creatinine, Ser 1.28 (*)    GFR calc non Af Amer 59 (*)    All other components within normal limits  CBC WITH DIFFERENTIAL/PLATELET    EKG None  Radiology Mr Brain Wo Contrast  Result Date: 02/26/2019 CLINICAL DATA:  Initial evaluation for acute left-sided numbness. EXAM: MRI HEAD WITHOUT CONTRAST TECHNIQUE: Multiplanar, multiecho pulse sequences of the brain and surrounding structures were obtained without intravenous contrast. COMPARISON:  Prior MRI from 08/31/2017. FINDINGS: Brain: Cerebral volume within normal limits for patient age. Single subcentimeter focus of FLAIR hyperintensity noted within the subcortical white matter of the right frontal region, of doubtful significance in isolation. No abnormal foci of restricted diffusion to suggest acute or subacute ischemia.  Gray-white matter differentiation well maintained. No encephalomalacia to suggest chronic infarction. No foci of susceptibility artifact to suggest acute or chronic intracranial hemorrhage. No mass lesion, midline shift or mass effect. No hydrocephalus. No extra-axial fluid collection. Major dural sinuses are grossly patent. Pituitary gland and suprasellar region are normal. Midline structures intact and normal. Vascular: Major intracranial vascular flow voids well maintained and normal in appearance. Skull and upper cervical spine: Craniocervical junction normal. Visualized upper cervical spine within normal limits. Bone marrow signal intensity normal. No scalp soft tissue abnormality. Sinuses/Orbits: Globes and orbital soft tissues within normal limits. Paranasal sinuses are clear. No mastoid effusion. Inner ear structures normal. Other: None. IMPRESSION: Normal brain MRI.  No acute intracranial abnormality identified. Electronically Signed   By: Marland Kitchen  Jeannine Boga M.D.   On: 02/26/2019 23:16    Procedures Procedures (including critical care time)  Medications Ordered in ED Medications - No data to display   Initial Impression / Assessment and Plan / ED Course  I have reviewed the triage vital signs and the nursing notes.  Pertinent labs & imaging results that were available during my care of the patient were reviewed by me and considered in my medical decision making (see chart for details).  Clinical Course as of Feb 26 1737  Thu Feb 26, 2019  2127 Discussed with [facility] with [facility], recommends MRI brain to rule out stroke but otherwise can follow-up in their clinic for outpatient follow-up   [RD]  2332 will update patient on results   [RD]  2351 Patient states the symptoms have resolved, will discharge home   [RD]    Clinical Course User Index [RD] Lucrezia Starch, MD       63 year old male presented to ER with isolated sensory complaint on his left  side described as tingling sensation.  At time of my initial assessment and examination, no focal neuro deficit was identified.  Discussed this with [facility] neurology on-call who had seen patient previously for similar issue, recommended obtaining MRI to rule out stroke but otherwise can follow-up in their clinic as outpatient.  MRI was negative.  On reassessment updating patient on results, he had no ongoing symptoms.  I suspect most likely his symptoms are related to peripheral neuropathy and less likely central etiology.  Believe he is appropriate for discharge and close outpatient follow-up.  Reviewed return precautions with patient and will discharge home at this time.    After the discussed management above, the patient was determined to be safe for discharge.  The patient was in agreement with this plan and all questions regarding their care were answered.  ED return precautions were discussed and the patient will return to the ED with any significant worsening of condition.    Final Clinical Impressions(s) / ED Diagnoses   Final diagnoses:  Neuropathy  Numbness and tingling of left arm and leg    ED Discharge Orders    None       Lucrezia Starch, MD 02/27/19 [phone]

## 2019-02-26 NOTE — ED Notes (Signed)
PT WALKED TO GET VITAL SIGNS WITHOUT DIFFICULTY. TRIAGE RN MADE AWARE PT PRESENT. ROOM ASSIGNED. PT WALKED BACK TO ROOM. PT REQUESTED TO USE BATHROOM. URINE CUP GIVEN. AWARE OF NEED FOR SAMPLE. PT DENIES ANY NEW COMPLAINTS OR WORSENING IN PRESENT SYMPTOM.

## 2019-02-26 NOTE — ED Notes (Signed)
Pt stated he has this same problem about a year ago

## 2019-02-26 NOTE — Discharge Instructions (Addendum)
Please follow-up with your neurologist for the symptoms you experience today.  If your symptoms progress, you develop weakness, vision changes, difficulty walking, please return to ER for reassessment.

## 2019-02-26 NOTE — ED Notes (Signed)
PT ENDORSES NO CHANGE IN NEURO STATUS. NUMBNESS TO LEFT ARM AND LEG THE SAME. DENIES WEAKNESS, VISUAL CHANGES, DISORIENTATION AND SPEECH CHANGES. WILL CONTINUE TO EVALUATE. GAIT STEADY, SPEECH CLEAR, FACIAL SYMMETRICAL. UPDATED ON BED STATUS AND PLAN OF CARE

## 2019-02-26 NOTE — ED Notes (Signed)
Patient transported to MRI 

## 2019-02-26 NOTE — ED Notes (Signed)
PT UP TO WINDOW. PT UPDATED ON CURRENT STATUS. NO NEURO CHANGES WITH LEFT ARM AND LEFT LEG. DENIES ANY NEW SYMPTOMS.

## 2019-02-26 NOTE — ED Triage Notes (Signed)
Pt presents with c/o left arm and leg numbness that started early this morning, no injuries. Pt denies any chest pain or any pain of any kind. Neuro intact, no deficits.

## 2019-03-18 ENCOUNTER — Other Ambulatory Visit: Payer: Self-pay

## 2019-03-18 DIAGNOSIS — Z20822 Contact with and (suspected) exposure to covid-19: Secondary | ICD-10-CM

## 2019-03-20 LAB — NOVEL CORONAVIRUS, NAA: SARS-CoV-2, NAA: DETECTED — AB

## 2019-03-21 ENCOUNTER — Telehealth: Payer: Self-pay | Admitting: Critical Care Medicine

## 2019-03-21 ENCOUNTER — Telehealth: Payer: Self-pay | Admitting: Unknown Physician Specialty

## 2019-03-21 NOTE — Telephone Encounter (Signed)
Discussed with patient and his wife about Covid symptoms and the use of bamlanivimab, a monoclonal antibody infusion for those with mild to moderate Covid symptoms and at a high risk of hospitalization.  Pt is qualified for this infusion at the [facility] infusion center due to hypertension and a  member of an at-risk group.     Pt and his wife want to think about it.  Gave Dr. Bettina Gavia number in case they decide yes.

## 2019-03-21 NOTE — Telephone Encounter (Signed)
I spoke to the patient and his wife and went over the details of the treatment of the monoclonal antibody for his Covid infection.  He is still undecided and they wish to think about it further.  I gave him my cell number and he will call back if he decides to move forward.  In reviewing his risk factors and his current symptom complex he would be an ideal patient for this treatment he would only be 6 days into the illness if he is treated on Monday, November 30

## 2019-03-22 ENCOUNTER — Other Ambulatory Visit: Payer: Self-pay | Admitting: Nurse Practitioner

## 2019-03-22 ENCOUNTER — Telehealth: Payer: Self-pay | Admitting: Nurse Practitioner

## 2019-03-22 DIAGNOSIS — U071 COVID-19: Secondary | ICD-10-CM

## 2019-03-22 NOTE — Telephone Encounter (Signed)
  I connected by phone with [name] on 03/22/2019 at 1:45 PM to discuss the potential use of an new treatment for mild to moderate COVID-19 viral infection in non-hospitalized patients.  Continues to have muscle aches and loss of taste + weakness.  Covid positive 03/18/19.  As of Tuesday he will be at 7 days of symptoms.  He would like to be scheduled for infusion at 8:30 am Tuesday.  This patient is a 63 y.o. male that meets the FDA criteria for Emergency Use Authorization of bamlanivimab:  Has a (+) direct SARS-CoV-2 viral test result  Has mild or moderate COVID-19   Is ? 63 years of age and weighs ? 40 kg  Is NOT hospitalized due to COVID-19  Is NOT requiring oxygen therapy or requiring an increase in baseline oxygen flow rate due to COVID-19  Is within 10 days of symptom onset  Has at least one of the high risk factor(s) for progression to severe COVID-19 and/or hospitalization as defined in EUA.  Specific high risk criteria : Hypertension age >93  Patient Active Problem List   Diagnosis Date Noted  . CTS (carpal tunnel syndrome) 04/29/2018  . Ulnar neuropathy at elbow, left 04/29/2018  . Left face and left arm tingling 03/04/2018  . Shortness of breath 09/13/2016  . Atypical chest pain 09/13/2016  . Essential hypertension 09/13/2016  . Hyperlipidemia 09/13/2016    I have spoken and communicated the following to the patient or parent/caregiver:  1. FDA has authorized the emergency use of bamlanivimab for the treatment of mild to moderate COVID-19 in adults and pediatric patients with positive results of direct SARS-CoV-2 viral testing who are 45 years of age and older weighing at least 40 kg, and who are at high risk for progressing to severe COVID-19 and/or hospitalization.  2. The significant known and potential risks and benefits of bamlanivimab, and the extent to which such potential risks and benefits are unknown.  3. Information on available alternative  treatments and the risks and benefits of those alternatives, including clinical trials.  4. Patients treated with bamlanivimab should continue to self-isolate and use infection control measures (e.g., wear mask, isolate, social distance, avoid sharing personal items, clean and disinfect "high touch" surfaces, and frequent handwashing) according to CDC guidelines.   5. The patient or parent/caregiver has the option to accept or refuse bamlanivimab.  After reviewing this information with the patient, The patient agreed to proceed with receiving the infusion of bamlanivimab and will be provided a copy of the Fact sheet prior to receiving the infusion.  [name] 03/22/2019 1:45 PM

## 2019-03-22 NOTE — Progress Notes (Signed)
Bam order set for infusion on Tuesday 03/24/19.

## 2019-03-23 NOTE — Telephone Encounter (Signed)
Appointment made for 830AM 12/1.  Patient notified by phone

## 2019-03-24 ENCOUNTER — Ambulatory Visit (HOSPITAL_COMMUNITY): Admission: RE | Admit: 2019-03-24 | Payer: BC Managed Care – PPO | Source: Ambulatory Visit

## 2019-03-25 ENCOUNTER — Encounter (HOSPITAL_COMMUNITY): Payer: Self-pay

## 2019-03-25 ENCOUNTER — Ambulatory Visit (HOSPITAL_COMMUNITY)
Admission: RE | Admit: 2019-03-25 | Discharge: 2019-03-25 | Disposition: A | Discharge status: Home / Self Care | Payer: BC Managed Care – PPO | Source: Ambulatory Visit | Attending: Critical Care Medicine | Admitting: Critical Care Medicine

## 2019-03-25 DIAGNOSIS — U071 COVID-19: Secondary | ICD-10-CM | POA: Insufficient documentation

## 2019-03-25 MED ORDER — FAMOTIDINE IN NACL 20-0.9 MG/50ML-% IV SOLN: 20.0000 mg | Freq: Once | INTRAVENOUS | Status: DC | PRN

## 2019-03-25 MED ORDER — SODIUM CHLORIDE 0.9 % IV SOLN
INTRAVENOUS | Status: DC | PRN
  Administered 2019-03-25: 1000 mL via INTRAVENOUS

## 2019-03-25 MED ORDER — ALBUTEROL SULFATE HFA 108 (90 BASE) MCG/ACT IN AERS: 2.0000 | INHALATION_SPRAY | Freq: Once | RESPIRATORY_TRACT | Status: DC | PRN

## 2019-03-25 MED ORDER — SODIUM CHLORIDE 0.9 % IV SOLN
700.0000 mg | Freq: Once | INTRAVENOUS | Status: AC
  Administered 2019-03-25: 09:00:00 700 mg via INTRAVENOUS
  Filled 2019-03-25: qty 20

## 2019-03-25 MED ORDER — DIPHENHYDRAMINE HCL 50 MG/ML IJ SOLN: 50.0000 mg | Freq: Once | INTRAMUSCULAR | Status: DC | PRN

## 2019-03-25 MED ORDER — METHYLPREDNISOLONE SODIUM SUCC 125 MG IJ SOLR: 125.0000 mg | Freq: Once | INTRAMUSCULAR | Status: DC | PRN

## 2019-03-25 MED ORDER — EPINEPHRINE 0.3 MG/0.3ML IJ SOAJ: 0.3000 mg | Freq: Once | INTRAMUSCULAR | Status: DC | PRN

## 2019-03-25 NOTE — Progress Notes (Signed)
  Diagnosis: COVID-19  Physician:  Procedure: bamlanivimab infusion Provided patient with bamlanivimab fact sheet for patients, parents and caregivers prior to infusion.  Complications: No immediate complications noted.  Discharge: Discharged home   Alivya Wegman N Jef Futch 03/25/2019   

## 2019-04-03 ENCOUNTER — Emergency Department (HOSPITAL_COMMUNITY)
Admission: EM | Admit: 2019-04-03 | Discharge: 2019-04-03 | Disposition: A | Discharge status: Home / Self Care | Payer: BC Managed Care – PPO | Attending: Emergency Medicine | Admitting: Emergency Medicine

## 2019-04-03 ENCOUNTER — Other Ambulatory Visit: Payer: Self-pay

## 2019-04-03 ENCOUNTER — Encounter (HOSPITAL_COMMUNITY): Payer: Self-pay | Admitting: Emergency Medicine

## 2019-04-03 DIAGNOSIS — Z79899 Other long term (current) drug therapy: Secondary | ICD-10-CM | POA: Diagnosis not present

## 2019-04-03 DIAGNOSIS — R200 Anesthesia of skin: Secondary | ICD-10-CM

## 2019-04-03 DIAGNOSIS — Z7982 Long term (current) use of aspirin: Secondary | ICD-10-CM | POA: Insufficient documentation

## 2019-04-03 DIAGNOSIS — I10 Essential (primary) hypertension: Secondary | ICD-10-CM | POA: Insufficient documentation

## 2019-04-03 DIAGNOSIS — R202 Paresthesia of skin: Secondary | ICD-10-CM | POA: Insufficient documentation

## 2019-04-03 NOTE — Discharge Instructions (Addendum)
I suspect your symptoms are related to nerve compression try and avoid putting pressure on your left elbow on the outside of your left knee as this can worsen the tingling and paresthesias you are experiencing.  Please call to schedule follow-up appointment with your neurologist.  Return for new or worsening symptoms.

## 2019-04-03 NOTE — ED Provider Notes (Signed)
[facility] DEPT Provider Note   CSN: [id number] Arrival date & time: 04/03/19  1307     History Chief Complaint  Patient presents with  . Tingling  . Covid+    Melvin Fisher is a 63 y.o. male.  Melvin Fisher is a 63 y.o. male with a history of hypertension, hepatitis C, BPH, and ulnar neuropathy, who presents to the ED for evaluation of tingling in his left arm and left lower extremity for about 2 weeks.  Patient was seen for similar symptoms in the ED on 11/5 and had normal MRI and had similar episode of symptoms in November of last year as well with reassuring MRI and outpatient neurology follow-up that revealed abnormal EMG of the ulnar nerve suggesting an ulnar nerve peripheral neuropathy.  Patient states that these symptoms have been intermittent he reports that are most prevalent in his left arm starting at his elbow with tingling radiating down through his fourth and fifth finger.  He reports that he has some intermittent tingling and paresthesia along the lateral portion of his left leg going down from his knee as well but this is less persistent.  He denies any associated weakness.  No associated headache, visual changes, facial asymmetry, speech difficulty, or dizziness.  He has not followed up with a neurologist since his last ED visit and reports after that visit symptoms went away but then seemed to come back.  Patient works as a Musician and after speaking with him more he does note that he sits with his left arm propped up on the windowsill of his work truck for most of his day while he is driving and also frequently sits with his left leg over top of his right putting pressure on the lateral portion of the left knee, and after these motion symptoms seem to be worse, he also states that tingling in his arm is sometimes worse after waking up from sleep.  He denies any associated injury, no neck or back pain.  No other aggravating or  alleviating factors.        Past Medical History:  Diagnosis Date  . BPH (benign prostatic hyperplasia)   . Diverticulosis   . Eczema   . Hemangioma of liver   . Hepatitis C   . Hypertension   . Palpitations     Patient Active Problem List   Diagnosis Date Noted  . Lab test positive for detection of COVID-19 virus 03/22/2019  . CTS (carpal tunnel syndrome) 04/29/2018  . Ulnar neuropathy at elbow, left 04/29/2018  . Left face and left arm tingling 03/04/2018  . Shortness of breath 09/13/2016  . Atypical chest pain 09/13/2016  . Essential hypertension 09/13/2016  . Hyperlipidemia 09/13/2016    Past Surgical History:  Procedure Laterality Date  . CARPAL TUNNEL RELEASE    . ROTATOR CUFF REPAIR         Family History  Problem Relation Age of Onset  . Hypertension Mother   . Cancer - Lung Father   . Hypertension Sister   . Hypertension Sister   . Diabetes Mellitus II Maternal Grandmother     Social History   Tobacco Use  . Smoking status: Never Smoker  . Smokeless tobacco: Never Used  Substance Use Topics  . Alcohol use: Yes    Comment: occasional  . Drug use: No    Home Medications Prior to Admission medications   Medication Sig Start Date End Date Taking? Authorizing Provider  aspirin  EC 81 MG tablet Take 81 mg by mouth daily.    [provider]  lisinopril (PRINIVIL,ZESTRIL) 20 MG tablet Take 10 mg by mouth daily.     [provider]  meloxicam (MOBIC) 15 MG tablet Take 15 mg by mouth daily as needed for pain.  11/26/17   [provider]  pravastatin (PRAVACHOL) 20 MG tablet Take 20 mg by mouth every other day.    [provider]  zolpidem (AMBIEN) 5 MG tablet Take 1 tablet (5 mg total) by mouth at bedtime as needed for sleep. 03/03/18   Veryl Speak, MD    Allergies    Patient has no known allergies.  Review of Systems   Review of Systems  Constitutional: Negative for chills and fever.  HENT: Negative.     Respiratory: Negative for cough and shortness of breath.   Cardiovascular: Negative for chest pain.  Gastrointestinal: Negative for abdominal pain, diarrhea and vomiting.  Musculoskeletal: Negative for back pain, myalgias and neck pain.  Skin: Negative for color change and rash.  Neurological: Positive for numbness. Negative for dizziness, seizures, syncope, facial asymmetry, speech difficulty, weakness, light-headedness and headaches.       Paresthesias    Physical Exam Updated Vital Signs BP 128/84   Pulse 87   Temp 99.1 F (37.3 C) (Oral)   Resp 18   SpO2 98%   Physical Exam Vitals and nursing note reviewed.  Constitutional:      General: He is not in acute distress.    Appearance: Normal appearance. He is well-developed and normal weight. He is not ill-appearing or diaphoretic.  HENT:     Head: Normocephalic and atraumatic.  Eyes:     General:        Right eye: No discharge.        Left eye: No discharge.     Pupils: Pupils are equal, round, and reactive to light.  Cardiovascular:     Rate and Rhythm: Normal rate and regular rhythm.     Heart sounds: Normal heart sounds.  Pulmonary:     Effort: Pulmonary effort is normal. No respiratory distress.     Breath sounds: Normal breath sounds. No wheezing or rales.     Comments: Respirations equal and unlabored, patient able to speak in full sentences, lungs clear to auscultation bilaterally Abdominal:     General: Bowel sounds are normal. There is no distension.     Palpations: Abdomen is soft. There is no mass.     Tenderness: There is no abdominal tenderness. There is no guarding.     Comments: Abdomen soft, nondistended, nontender to palpation in all quadrants without guarding or peritoneal signs  Musculoskeletal:        General: No deformity.     Cervical back: Neck supple.  Skin:    General: Skin is warm and dry.     Capillary Refill: Capillary refill takes less than 2 seconds.  Neurological:     Mental Status:  He is alert.     Coordination: Coordination normal.     Comments: Speech is clear, able to follow commands CN III-XII intact Normal strength in upper and lower extremities bilaterally including dorsiflexion and plantar flexion, strong and equal grip strength Sensation intact in all 4 extremities, patient does report some subjective decreased sensation on the left arm extending from the elbow to the fourth and fifth fingers as well as over the lateral left calf.  Tingling sensation patient describes is reproducible with percussion over  the cubital tunnel. Moves extremities without ataxia, coordination intact   Psychiatric:        Mood and Affect: Mood normal.        Behavior: Behavior normal.     ED Results / Procedures / Treatments   Labs (all labs ordered are listed, but only abnormal results are displayed) Labs Reviewed - No data to display  EKG None  Radiology No results found.  Procedures Procedures (including critical care time)  Medications Ordered in ED Medications - No data to display  ED Course  I have reviewed the triage vital signs and the nursing notes.  Pertinent labs & imaging results that were available during my care of the patient were reviewed by me and considered in my medical decision making (see chart for details).    MDM Rules/Calculators/A&P  63 year old male presents with intermittent tingling and decreased sensation in the left arm and left leg.  The symptoms are localized to the distributions of the ulnar and peroneal nerves.  Symptoms seem to be worse after pressure is put on these peripheral nerves, patient sits for most of the day with his arm propped up over his elbow, I suspect most of these symptoms are due to patient's specific frequent behaviors.  He had recent ED visit with MRI for the same symptoms which showed no central lesions to explain symptoms and has had previous EMG testing of the left ulnar nerve that was abnormal.  I have discussed  with patient trying to limit putting pressure on these areas that can affect nerve sensation and cause paresthesia and have requested that he follow-up again with his neurologist.   At this time there does not appear to be any evidence of an acute emergency medical condition and the patient appears stable for discharge with appropriate outpatient follow up.Diagnosis was discussed with patient who verbalizes understanding and is agreeable to discharge. Pt case discussed with Dr. Zenia Resides who agrees with my plan.    Final Clinical Impression(s) / ED Diagnoses Final diagnoses:  Numbness and tingling of left arm and leg    Rx / DC Orders ED Discharge Orders    None       Janet Berlin 04/05/19 0940    Lacretia Leigh, MD 04/06/19 1727

## 2019-04-03 NOTE — ED Triage Notes (Signed)
Patient reports tingling in left arm x2 weeks. Reports seen for same on 12/2. Covid+ on 11/25. Full ROM. States tingling is localized to elbow and upper arm. Ambulatory.

## 2019-04-11 ENCOUNTER — Other Ambulatory Visit: Payer: Self-pay

## 2019-04-11 ENCOUNTER — Emergency Department (HOSPITAL_COMMUNITY)
Admission: EM | Admit: 2019-04-11 | Discharge: 2019-04-11 | Disposition: A | Discharge status: Home / Self Care | Payer: BC Managed Care – PPO | Attending: Emergency Medicine | Admitting: Emergency Medicine

## 2019-04-11 ENCOUNTER — Encounter (HOSPITAL_COMMUNITY): Payer: Self-pay

## 2019-04-11 DIAGNOSIS — Z79899 Other long term (current) drug therapy: Secondary | ICD-10-CM | POA: Diagnosis not present

## 2019-04-11 DIAGNOSIS — R0789 Other chest pain: Secondary | ICD-10-CM | POA: Insufficient documentation

## 2019-04-11 DIAGNOSIS — R202 Paresthesia of skin: Secondary | ICD-10-CM | POA: Diagnosis present

## 2019-04-11 DIAGNOSIS — F419 Anxiety disorder, unspecified: Secondary | ICD-10-CM | POA: Diagnosis not present

## 2019-04-11 DIAGNOSIS — Z7982 Long term (current) use of aspirin: Secondary | ICD-10-CM | POA: Insufficient documentation

## 2019-04-11 DIAGNOSIS — I10 Essential (primary) hypertension: Secondary | ICD-10-CM | POA: Diagnosis not present

## 2019-04-11 DIAGNOSIS — R200 Anesthesia of skin: Secondary | ICD-10-CM

## 2019-04-11 HISTORY — DX: Anxiety disorder, unspecified: F41.9

## 2019-04-11 LAB — RAPID URINE DRUG SCREEN, HOSP PERFORMED
Amphetamines: NOT DETECTED
Barbiturates: NOT DETECTED
Benzodiazepines: NOT DETECTED
Cocaine: NOT DETECTED
Opiates: NOT DETECTED
Tetrahydrocannabinol: NOT DETECTED

## 2019-04-11 LAB — CBC
HCT: 44.6 % (ref 39.0–52.0)
Hemoglobin: 15.1 g/dL (ref 13.0–17.0)
MCH: 29.6 pg (ref 26.0–34.0)
MCHC: 33.9 g/dL (ref 30.0–36.0)
MCV: 87.5 fL (ref 80.0–100.0)
Platelets: 223 10*3/uL (ref 150–400)
RBC: 5.1 MIL/uL (ref 4.22–5.81)
RDW: 12 % (ref 11.5–15.5)
WBC: 8.8 10*3/uL (ref 4.0–10.5)
nRBC: 0 % (ref 0.0–0.2)

## 2019-04-11 LAB — COMPREHENSIVE METABOLIC PANEL
ALT: 18 U/L (ref 0–44)
AST: 20 U/L (ref 15–41)
Albumin: 4.3 g/dL (ref 3.5–5.0)
Alkaline Phosphatase: 52 U/L (ref 38–126)
Anion gap: 8 (ref 5–15)
BUN: 17 mg/dL (ref 8–23)
CO2: 28 mmol/L (ref 22–32)
Calcium: 9.3 mg/dL (ref 8.9–10.3)
Chloride: 103 mmol/L (ref 98–111)
Creatinine, Ser: 1.05 mg/dL (ref 0.61–1.24)
GFR calc Af Amer: >60 mL/min (ref >60)
GFR calc non Af Amer: >60 mL/min (ref >60)
Glucose, Bld: 109 mg/dL — ABNORMAL HIGH (ref 70–99)
Potassium: 3.7 mmol/L (ref 3.5–5.1)
Sodium: 139 mmol/L (ref 135–145)
Total Bilirubin: 1.3 mg/dL — ABNORMAL HIGH (ref 0.3–1.2)
Total Protein: 7 g/dL (ref 6.5–8.1)

## 2019-04-11 LAB — URINALYSIS, ROUTINE W REFLEX MICROSCOPIC
Bilirubin Urine: NEGATIVE
Glucose, UA: NEGATIVE mg/dL
Hgb urine dipstick: NEGATIVE
Ketones, ur: 5 mg/dL — AB
Leukocytes,Ua: NEGATIVE
Nitrite: NEGATIVE
Protein, ur: NEGATIVE mg/dL
Specific Gravity, Urine: 1.019 (ref 1.005–1.030)
pH: 6 (ref 5.0–8.0)

## 2019-04-11 LAB — CBG MONITORING, ED: Glucose-Capillary: 94 mg/dL (ref 70–99)

## 2019-04-11 MED ORDER — LORAZEPAM 1 MG PO TABS: 1.0000 mg | ORAL_TABLET | Freq: Two times a day (BID) | ORAL | 0 refills | Status: DC | PRN

## 2019-04-11 MED ORDER — LORAZEPAM 1 MG PO TABS
1.0000 mg | ORAL_TABLET | Freq: Once | ORAL | Status: AC
  Administered 2019-04-11: 17:00:00 1 mg via ORAL
  Filled 2019-04-11: qty 1

## 2019-04-11 NOTE — ED Notes (Addendum)
Pt reports he was dx with anxiety on Tuesday and began taking Lorazepam.

## 2019-04-11 NOTE — ED Triage Notes (Signed)
Pt reports left sided facial, arm and leg numbness. Pt also reports left sided chest pain. Pt stated he had similar symptoms 2 weeks ago, however today he is having the facial numbness, vision changes and crushing headache. Pt is ambulatory, speaking in clear full sentences and full ROM.

## 2019-04-11 NOTE — Discharge Instructions (Addendum)
1.  Follow-up with Dr. Lavell Anchors as scheduled.  Return to the emergency department if your symptoms are worsening or changing. 2.  You have been given a prescription for Ativan.  This can be addictive.  Use sparingly for episodes of significant anxiety.  Discussed this prescription with your doctor and your health provider. 3.  Return to the emergency department if you develop concerning or worsening symptoms.

## 2019-04-11 NOTE — ED Provider Notes (Signed)
[facility] DEPT Provider Note   CSN: [id number] Arrival date & time: 04/11/19  1627     History Chief Complaint  Patient presents with  . Left Sided Numbness  . Chest Pain    Melvin Fisher is a 63 y.o. male.  HPI Patient reports that he is having numbness sensation on his left arm.  This is predominantly the ulnar aspect coming up behind the elbow and then somewhat onto the chest wall.  This is a problem he has had before.  Also he perceives some numbness over the lateral aspect of the left leg.  He reports today's things seemed kind of different because there was also a feeling of numbness on the lips on the left side and right around his eye on the left side.  No weakness or dysfunction.  No confusion.  No speech impairment.  Patient reports sometimes he has got a slight headache.  Symptoms have been more pronounced for him over the past 2 days.  Patient was diagnosed with anxiety over the past week.  He was started on Lexapro.  He has been taking that for several days.  He reports he is having a lot of trouble sleeping at night.  He reports after he gets home from work he is just so wound up he has a very difficult time trying to settle down and relax.  No drug or alcohol use.    Past Medical History:  Diagnosis Date  . Anxiety   . BPH (benign prostatic hyperplasia)   . Diverticulosis   . Eczema   . Hemangioma of liver   . Hepatitis C   . Hypertension   . Palpitations     Patient Active Problem List   Diagnosis Date Noted  . Lab test positive for detection of COVID-19 virus 03/22/2019  . CTS (carpal tunnel syndrome) 04/29/2018  . Ulnar neuropathy at elbow, left 04/29/2018  . Left face and left arm tingling 03/04/2018  . Shortness of breath 09/13/2016  . Atypical chest pain 09/13/2016  . Essential hypertension 09/13/2016  . Hyperlipidemia 09/13/2016    Past Surgical History:  Procedure Laterality Date  . CARPAL TUNNEL RELEASE      . ROTATOR CUFF REPAIR         Family History  Problem Relation Age of Onset  . Hypertension Mother   . Cancer - Lung Father   . Hypertension Sister   . Hypertension Sister   . Diabetes Mellitus II Maternal Grandmother     Social History   Tobacco Use  . Smoking status: Never Smoker  . Smokeless tobacco: Never Used  Substance Use Topics  . Alcohol use: Yes    Comment: occasional  . Drug use: No    Home Medications Prior to Admission medications   Medication Sig Start Date End Date Taking? Authorizing Provider  aspirin EC 81 MG tablet Take 81 mg by mouth daily.   Yes [provider]  escitalopram (LEXAPRO) 10 MG tablet Take 10 mg by mouth daily. 04/06/19  Yes [provider]  lisinopril (PRINIVIL,ZESTRIL) 20 MG tablet Take 10 mg by mouth daily.    Yes [provider]  meloxicam (MOBIC) 15 MG tablet Take 15 mg by mouth daily as needed for pain.  11/26/17  Yes [provider]  pravastatin (PRAVACHOL) 20 MG tablet Take 20 mg by mouth every other day.   Yes [provider]  zolpidem (AMBIEN) 5 MG tablet Take 1 tablet (5 mg total) by  mouth at bedtime as needed for sleep. 03/03/18  Yes Delo, Nathaneil Canary, MD  LORazepam (ATIVAN) 1 MG tablet Take 1 tablet (1 mg total) by mouth 2 (two) times daily as needed for anxiety. 04/11/19   Charlesetta Shanks, MD    Allergies    Patient has no known allergies.  Review of Systems   Review of Systems 10 Systems reviewed and are negative for acute change except as noted in the HPI.  Physical Exam Updated Vital Signs BP (!) 144/95   Pulse 66   Temp 97.9 F (36.6 C)   Resp 16   SpO2 100%   Physical Exam Constitutional:      Appearance: He is well-developed.  HENT:     Head: Normocephalic and atraumatic.     Mouth/Throat:     Mouth: Mucous membranes are moist.     Pharynx: Oropharynx is clear.  Eyes:     Extraocular Movements: Extraocular movements intact.     Conjunctiva/sclera: Conjunctivae  normal.     Pupils: Pupils are equal, round, and reactive to light.  Cardiovascular:     Rate and Rhythm: Normal rate and regular rhythm.     Heart sounds: Normal heart sounds.  Pulmonary:     Effort: Pulmonary effort is normal.     Breath sounds: Normal breath sounds.  Abdominal:     General: Bowel sounds are normal. There is no distension.     Palpations: Abdomen is soft.     Tenderness: There is no abdominal tenderness.  Musculoskeletal:        General: Normal range of motion.     Cervical back: Neck supple.  Skin:    General: Skin is warm and dry.  Neurological:     General: No focal deficit present.     Mental Status: He is alert and oriented to person, place, and time.     GCS: GCS eye subscore is 4. GCS verbal subscore is 5. GCS motor subscore is 6.     Cranial Nerves: No cranial nerve deficit.     Sensory: No sensory deficit.     Motor: No weakness.     Coordination: Coordination normal.     Comments: Cognitive function is normal.  Speech is clear.  Cranial nerves II through XII intact.  Finger-nose exam is normal.  Motor strength 5\5 upper and lower extremities.  Sensation intact to light touch.  Patient perceives slight difference over the lateral left hand and lateral left leg.  Psychiatric:        Mood and Affect: Mood normal.     ED Results / Procedures / Treatments   Labs (all labs ordered are listed, but only abnormal results are displayed) Labs Reviewed  COMPREHENSIVE METABOLIC PANEL - Abnormal; Notable for the following components:      Result Value   Glucose, Bld 109 (*)    Total Bilirubin 1.3 (*)    All other components within normal limits  URINALYSIS, ROUTINE W REFLEX MICROSCOPIC - Abnormal; Notable for the following components:   Ketones, ur 5 (*)    All other components within normal limits  CBC  RAPID URINE DRUG SCREEN, HOSP PERFORMED  CBG MONITORING, ED    EKG EKG Interpretation  Date/Time:  Saturday April 11 2019 16:37:59  EST Ventricular Rate:  69 PR Interval:    QRS Duration: 89 QT Interval:  403 QTC Calculation: 432 R Axis:   -13 Text Interpretation: Sinus rhythm Abnormal R-wave progression, early transition no STEMI, no sig change from  previous Confirmed by Charlesetta Shanks [phone]) on 04/11/2019 4:50:31 PM   Radiology No results found.  Procedures Procedures (including critical care time)  Medications Ordered in ED Medications  LORazepam (ATIVAN) tablet 1 mg (1 mg Oral Given 04/11/19 1729)    ED Course  I have reviewed the triage vital signs and the nursing notes.  Pertinent labs & imaging results that were available during my care of the patient were reviewed by me and considered in my medical decision making (see chart for details).    MDM Rules/Calculators/A&P                      Patient is alert and appropriate.  He has been perceiving increased numbness on the left side over the past several days.  This has been a recurrent symptom for the patient.  He has had several evaluations in the emergency department and has seen Dr. Lavell Anchors on outpatient basis.  Patient had normal MRI a little over a month ago, a year ago and 3 years ago.  At this time, I have low suspicion for acute CVA.  I have checked basic lab work, no electrolyte disorders.  Patient's vital signs are normal.  He is clinically well in appearance.  He has been dealing with increased anxiety and recently started on Lexapro.  Patient was given a 1 mg dose of Ativan which significantly improved his symptoms.  It seems fairly likely that this is related to anxiety.  I will prescribe a short course of as needed Ativan for the next week.  He is advised that people can experience increased agitation and restlessness when starting Lexapro and this should improve over the next couple weeks.  Patient is also already scheduled to see Dr. Lavell Anchors for recheck at the end of this month.  I feel he is stable for discharge.  Return precautions are  reviewed. Final Clinical Impression(s) / ED Diagnoses Final diagnoses:  Numbness and tingling of left arm and leg  Anxiety    Rx / DC Orders ED Discharge Orders         Ordered    LORazepam (ATIVAN) 1 MG tablet  2 times daily PRN     04/11/19 2026           Charlesetta Shanks, MD 04/11/19 2036

## 2019-04-21 DIAGNOSIS — H9113 Presbycusis, bilateral: Secondary | ICD-10-CM | POA: Insufficient documentation

## 2019-04-21 DIAGNOSIS — F418 Other specified anxiety disorders: Secondary | ICD-10-CM | POA: Insufficient documentation

## 2019-04-21 DIAGNOSIS — H9313 Tinnitus, bilateral: Secondary | ICD-10-CM | POA: Insufficient documentation

## 2019-04-25 ENCOUNTER — Emergency Department (HOSPITAL_COMMUNITY)
Admission: EM | Admit: 2019-04-25 | Discharge: 2019-04-25 | Discharge status: Absent without leave | Payer: BC Managed Care – PPO

## 2019-05-01 ENCOUNTER — Other Ambulatory Visit: Payer: Self-pay

## 2019-05-01 ENCOUNTER — Encounter (HOSPITAL_COMMUNITY): Payer: Self-pay | Admitting: Emergency Medicine

## 2019-05-01 ENCOUNTER — Emergency Department (HOSPITAL_COMMUNITY): Payer: BC Managed Care – PPO

## 2019-05-01 ENCOUNTER — Emergency Department (HOSPITAL_COMMUNITY)
Admission: EM | Admit: 2019-05-01 | Discharge: 2019-05-01 | Disposition: A | Discharge status: Home / Self Care | Payer: BC Managed Care – PPO | Attending: Emergency Medicine | Admitting: Emergency Medicine

## 2019-05-01 DIAGNOSIS — U071 COVID-19: Secondary | ICD-10-CM | POA: Insufficient documentation

## 2019-05-01 DIAGNOSIS — R519 Headache, unspecified: Secondary | ICD-10-CM | POA: Diagnosis not present

## 2019-05-01 DIAGNOSIS — Z79899 Other long term (current) drug therapy: Secondary | ICD-10-CM | POA: Diagnosis not present

## 2019-05-01 DIAGNOSIS — R55 Syncope and collapse: Secondary | ICD-10-CM

## 2019-05-01 DIAGNOSIS — I10 Essential (primary) hypertension: Secondary | ICD-10-CM | POA: Diagnosis not present

## 2019-05-01 DIAGNOSIS — R202 Paresthesia of skin: Secondary | ICD-10-CM | POA: Diagnosis not present

## 2019-05-01 DIAGNOSIS — F329 Major depressive disorder, single episode, unspecified: Secondary | ICD-10-CM | POA: Insufficient documentation

## 2019-05-01 DIAGNOSIS — Z7982 Long term (current) use of aspirin: Secondary | ICD-10-CM | POA: Insufficient documentation

## 2019-05-01 LAB — URINALYSIS, ROUTINE W REFLEX MICROSCOPIC
Bilirubin Urine: NEGATIVE
Glucose, UA: NEGATIVE mg/dL
Hgb urine dipstick: NEGATIVE
Ketones, ur: 20 mg/dL — AB
Leukocytes,Ua: NEGATIVE
Nitrite: NEGATIVE
Protein, ur: NEGATIVE mg/dL
Specific Gravity, Urine: 1.024 (ref 1.005–1.030)
pH: 5 (ref 5.0–8.0)

## 2019-05-01 LAB — BASIC METABOLIC PANEL
Anion gap: 11 (ref 5–15)
BUN: 22 mg/dL (ref 8–23)
CO2: 26 mmol/L (ref 22–32)
Calcium: 9.6 mg/dL (ref 8.9–10.3)
Chloride: 98 mmol/L (ref 98–111)
Creatinine, Ser: 1.2 mg/dL (ref 0.61–1.24)
GFR calc Af Amer: >60 mL/min (ref >60)
GFR calc non Af Amer: >60 mL/min (ref >60)
Glucose, Bld: 103 mg/dL — ABNORMAL HIGH (ref 70–99)
Potassium: 4 mmol/L (ref 3.5–5.1)
Sodium: 135 mmol/L (ref 135–145)

## 2019-05-01 LAB — CBC
HCT: 48.1 % (ref 39.0–52.0)
Hemoglobin: 16.3 g/dL (ref 13.0–17.0)
MCH: 30 pg (ref 26.0–34.0)
MCHC: 33.9 g/dL (ref 30.0–36.0)
MCV: 88.4 fL (ref 80.0–100.0)
Platelets: 221 10*3/uL (ref 150–400)
RBC: 5.44 MIL/uL (ref 4.22–5.81)
RDW: 12 % (ref 11.5–15.5)
WBC: 10.2 10*3/uL (ref 4.0–10.5)
nRBC: 0 % (ref 0.0–0.2)

## 2019-05-01 LAB — CBG MONITORING, ED: Glucose-Capillary: 98 mg/dL (ref 70–99)

## 2019-05-01 MED ORDER — SODIUM CHLORIDE 0.9 % IV BOLUS
1000.0000 mL | Freq: Once | INTRAVENOUS | Status: AC
  Administered 2019-05-01: 1000 mL via INTRAVENOUS

## 2019-05-01 NOTE — ED Provider Notes (Signed)
[facility] DEPT Provider Note   CSN: [id number] Arrival date & time: 05/01/19  1453     History Chief Complaint  Patient presents with  . Dizziness    NICOLAAS LANDUYT is a 64 y.o. male.  The history is provided by the patient, the spouse and medical records. No language interpreter was used.  Dizziness    64 year old male with history of anxiety, hypertension, heart palpitation, hepatitis, previously diagnosed with positive COVID-19 on 03/18/2019 presenting complaining of headache and dizziness.  Over the past several weeks patient has had intermittent headache and bouts of dizziness in which he described more as a lightheadedness with positional change.  Described headache as a throbbing sensation to the back of his head and behind both his eyes, intermittent but 2 days ago he felt something pop in his head follows with headache.  He does endorse occasional tingling sensation to both of his forearms, sometimes around his mouth, which has been waxing and waning for the past several weeks.  He endorsed feeling depressed, have not been eating much and has been losing weight.  States that he has been seen by his PCP and has been placed on several different types of antidepressant including Lexapro without adequate relief.  Denies SI or HI.  Does have an appointment with neurologist on the 27th of this month which is approximately 19 days from now.  Patient does not complain of any neck pain, chest pain, trouble breathing, abdominal pain, back pain or pain to his extremities.  Patient has been seen in the ED several times for this complaint.  He has had normal MRI for the same symptoms several times in the past including little over a month ago a year ago and 3 years ago which was normal.  He was last given Ativan at discharge approximately 2 to 3 weeks ago for similar symptoms but report no significant provement.  Past Medical History:  Diagnosis Date  .  Anxiety   . BPH (benign prostatic hyperplasia)   . Diverticulosis   . Eczema   . Hemangioma of liver   . Hepatitis C   . Hypertension   . Palpitations     Patient Active Problem List   Diagnosis Date Noted  . Lab test positive for detection of COVID-19 virus 03/22/2019  . CTS (carpal tunnel syndrome) 04/29/2018  . Ulnar neuropathy at elbow, left 04/29/2018  . Left face and left arm tingling 03/04/2018  . Shortness of breath 09/13/2016  . Atypical chest pain 09/13/2016  . Essential hypertension 09/13/2016  . Hyperlipidemia 09/13/2016    Past Surgical History:  Procedure Laterality Date  . CARPAL TUNNEL RELEASE    . ROTATOR CUFF REPAIR         Family History  Problem Relation Age of Onset  . Hypertension Mother   . Cancer - Lung Father   . Hypertension Sister   . Hypertension Sister   . Diabetes Mellitus II Maternal Grandmother     Social History   Tobacco Use  . Smoking status: Never Smoker  . Smokeless tobacco: Never Used  Substance Use Topics  . Alcohol use: Yes    Comment: occasional  . Drug use: No    Home Medications Prior to Admission medications   Medication Sig Start Date End Date Taking? Authorizing Provider  aspirin EC 81 MG tablet Take 81 mg by mouth daily.   Yes [provider]  lisinopril (PRINIVIL,ZESTRIL) 20 MG tablet Take 10 mg by mouth daily.  Yes [provider]  meloxicam (MOBIC) 15 MG tablet Take 15 mg by mouth daily as needed for pain.  11/26/17  Yes [provider]  pravastatin (PRAVACHOL) 20 MG tablet Take 20 mg by mouth every other day.   Yes [provider]  zolpidem (AMBIEN) 10 MG tablet Take 10 mg by mouth at bedtime as needed for sleep. 03/18/19  Yes [provider]  LORazepam (ATIVAN) 1 MG tablet Take 1 tablet (1 mg total) by mouth 2 (two) times daily as needed for anxiety. Patient not taking: Reported on 05/01/2019 04/11/19   Charlesetta Shanks, MD  zolpidem (AMBIEN) 5 MG tablet Take 1  tablet (5 mg total) by mouth at bedtime as needed for sleep. Patient not taking: Reported on 05/01/2019 03/03/18   Veryl Speak, MD    Allergies    Patient has no known allergies.  Review of Systems   Review of Systems  Neurological: Positive for dizziness.  All other systems reviewed and are negative.   Physical Exam Updated Vital Signs BP (!) 155/107 (BP Location: Right Arm)   Pulse 75   Temp 98.1 F (36.7 C) (Oral)   Resp 15   SpO2 99%   Physical Exam Vitals and nursing note reviewed.  Constitutional:      General: He is not in acute distress.    Appearance: He is well-developed.  HENT:     Head: Atraumatic.  Eyes:     Conjunctiva/sclera: Conjunctivae normal.  Cardiovascular:     Rate and Rhythm: Normal rate and regular rhythm.     Pulses: Normal pulses.     Heart sounds: Normal heart sounds.  Pulmonary:     Effort: Pulmonary effort is normal.     Breath sounds: Normal breath sounds.  Abdominal:     Palpations: Abdomen is soft.     Tenderness: There is no abdominal tenderness.  Musculoskeletal:     Cervical back: Neck supple.  Skin:    Findings: No rash.  Neurological:     Mental Status: He is alert and oriented to person, place, and time.     Comments: Neurologic exam:  Speech clear, pupils equal round reactive to light, extraocular movements intact  Normal peripheral visual fields Cranial nerves III through XII normal including no facial droop Follows commands, moves all extremities x4, normal strength to bilateral upper and lower extremities at all major muscle groups including grip Sensation normal to light touch with exception of subjective decrease sensation to L side of face without facial droop. Coordination intact, no limb ataxia, finger-nose-finger normal Rapid alternating movements normal No pronator drift Gait normal      ED Results / Procedures / Treatments   Labs (all labs ordered are listed, but only abnormal results are  displayed) Labs Reviewed  BASIC METABOLIC PANEL - Abnormal; Notable for the following components:      Result Value   Glucose, Bld 103 (*)    All other components within normal limits  URINALYSIS, ROUTINE W REFLEX MICROSCOPIC - Abnormal; Notable for the following components:   Ketones, ur 20 (*)    All other components within normal limits  CBC  CBG MONITORING, ED    EKG None   Date: 05/01/2019  Rate: 77  Rhythm: normal sinus rhythm  QRS Axis: normal  Intervals: normal  ST/T Wave abnormalities: normal  Conduction Disutrbances: none  Narrative Interpretation:   Old EKG Reviewed: No significant changes noted     Radiology CT Head Wo Contrast  Result Date:  05/01/2019 CLINICAL DATA:  Acute headaches EXAM: CT HEAD WITHOUT CONTRAST TECHNIQUE: Contiguous axial images were obtained from the base of the skull through the vertex without intravenous contrast. COMPARISON:  07/18/2010 FINDINGS: Brain: No evidence of acute infarction, hemorrhage, hydrocephalus, extra-axial collection or mass lesion/mass effect. Vascular: No hyperdense vessel or unexpected calcification. Skull: Normal. Negative for fracture or focal lesion. Sinuses/Orbits: No acute finding. Other: None. IMPRESSION: Normal head CT for age Electronically Signed   By: Inez Catalina M.D.   On: 05/01/2019 18:10    Procedures Procedures (including critical care time)  Medications Ordered in ED Medications  sodium chloride 0.9 % bolus 1,000 mL (1,000 mLs Intravenous New Bag/Given 05/01/19 1702)    ED Course  I have reviewed the triage vital signs and the nursing notes.  Pertinent labs & imaging results that were available during my care of the patient were reviewed by me and considered in my medical decision making (see chart for details).    MDM Rules/Calculators/A&P                      BP (!) 155/107 (BP Location: Right Arm)   Pulse 75   Temp 98.1 F (36.7 C) (Oral)   Resp 15   SpO2 99%   Final Clinical  Impression(s) / ED Diagnoses Final diagnoses:  Near syncope    Rx / DC Orders ED Discharge Orders    None     Patient here with intermittent headache, intermittent tingling sensation to his forearms and lips and report feeling depressed, and cannot tolerate the past several antidepression medication that was prescribed to him by his PCP for similar symptoms.  He has been seen in the ER multiple times within the past several months for similar symptoms with negative brain MRI to suggest acute stroke.  He has no focal neuro deficit on exam today.  Will perform screening labs, obtain head CT scan.  At this time I have low suspicion for acute stroke as well.  6:36 PM Positive orthostatic vital sign.  Patient received IV fluid and felt better.  EKG without concerning arrhythmia. Head CT scan unremarkable.  Labs are reassuring.  As mentioned earlier patient has no focal neuro deficit on exam and does have an appointment to follow-up with neurology in which I encourage patient to do so.  I suspect some of his symptoms may be lingering effect from previous COVID-19 infection as well as changing his psych medication.  Return precaution discussed.   Domenic Moras, PA-C 05/01/19 1839    Milton Ferguson, MD 05/01/19 [phone]

## 2019-05-01 NOTE — ED Triage Notes (Signed)
Pt c/o dizziness and tingling on left arm for several weeks.

## 2019-05-01 NOTE — Discharge Instructions (Signed)
Please follow up with your doctor for further management of your medication. Follow up with neurology to assessment of your tingling numbness sensation.  Stay hydrated as dehydration may contribute to your light headedness.  Return if you have any concerns.

## 2019-05-20 ENCOUNTER — Encounter: Payer: Self-pay | Admitting: Neurology

## 2019-05-20 ENCOUNTER — Ambulatory Visit: Payer: BC Managed Care – PPO | Admitting: Neurology

## 2019-05-20 ENCOUNTER — Other Ambulatory Visit: Payer: Self-pay

## 2019-05-20 VITALS — BP 104/63 | HR 87 | Temp 97.3°F | Ht 70.0 in | Wt 183.0 lb

## 2019-05-20 DIAGNOSIS — R531 Weakness: Secondary | ICD-10-CM | POA: Diagnosis not present

## 2019-05-20 DIAGNOSIS — R739 Hyperglycemia, unspecified: Secondary | ICD-10-CM

## 2019-05-20 DIAGNOSIS — R202 Paresthesia of skin: Secondary | ICD-10-CM

## 2019-05-20 DIAGNOSIS — M6259 Muscle wasting and atrophy, not elsewhere classified, multiple sites: Secondary | ICD-10-CM

## 2019-05-20 DIAGNOSIS — R200 Anesthesia of skin: Secondary | ICD-10-CM | POA: Diagnosis not present

## 2019-05-20 DIAGNOSIS — G959 Disease of spinal cord, unspecified: Secondary | ICD-10-CM

## 2019-05-20 DIAGNOSIS — R634 Abnormal weight loss: Secondary | ICD-10-CM

## 2019-05-20 DIAGNOSIS — R5382 Chronic fatigue, unspecified: Secondary | ICD-10-CM

## 2019-05-20 NOTE — Patient Instructions (Signed)
MRI of the cervical spine EMG/NCS Blood work   Nurse, mental health is a test to check how well your muscles and nerves are working. This procedure includes the combined use of electromyogram (EMG) and nerve conduction study (NCS). EMG is used to look for muscular disorders. NCS, which is also called electroneurogram, measures how well your nerves are controlling your muscles. The procedures are usually done together to check if your muscles and nerves are healthy. If the results of the tests are abnormal, this may indicate disease or injury, such as a neuromuscular disease or peripheral nerve damage. Tell a health care provider about:  Any allergies you have.  All medicines you are taking, including vitamins, herbs, eye drops, creams, and over-the-counter medicines.  Any problems you or family members have had with anesthetic medicines.  Any blood disorders you have.  Any surgeries you have had.  Any medical conditions you have.  If you have a pacemaker.  Whether you are pregnant or may be pregnant. What are the risks? Generally, this is a safe procedure. However, problems may occur, including:  Infection where the electrodes were inserted.  Bleeding. What happens before the procedure? Medicines Ask your health care provider about:  Changing or stopping your regular medicines. This is especially important if you are taking diabetes medicines or blood thinners.  Taking medicines such as aspirin and ibuprofen. These medicines can thin your blood. Do not take these medicines unless your health care provider tells you to take them.  Taking over-the-counter medicines, vitamins, herbs, and supplements. General instructions  Your health care provider may ask you to avoid: ? Beverages that have caffeine, such as coffee and tea. ? Any products that contain nicotine or tobacco. These products include cigarettes, e-cigarettes, and chewing tobacco. If you need help  quitting, ask your health care provider.  Do not use lotions or creams on the same day that you will be having the procedure. What happens during the procedure? For EMG   Your health care provider will ask you to stay in a position so that he or she can access the muscle that will be studied. You may be standing, sitting, or lying down.  You may be given a medicine that numbs the area (local anesthetic).  A very thin needle that has an electrode will be inserted into your muscle.  Another small electrode will be placed on your skin near the muscle.  Your health care provider will ask you to continue to remain still.  The electrodes will send a signal that tells about the electrical activity of your muscles. You may see this on a monitor or hear it in the room.  After your muscles have been studied at rest, your health care provider will ask you to contract or flex your muscles. The electrodes will send a signal that tells about the electrical activity of your muscles.  Your health care provider will remove the electrodes and the electrode needles when the procedure is finished. The procedure may vary among health care providers and hospitals. For NCS   An electrode that records your nerve activity (recording electrode) will be placed on your skin by the muscle that is being studied.  An electrode that is used as a reference (reference electrode) will be placed near the recording electrode.  A paste or gel will be applied to your skin between the recording electrode and the reference electrode.  Your nerve will be stimulated with a mild shock. Your health care provider will measure  how much time it takes for your muscle to react.  Your health care provider will remove the electrodes and the gel when the procedure is finished. The procedure may vary among health care providers and hospitals. What happens after the procedure?  It is up to you to get the results of your procedure.  Ask your health care provider, or the department that is doing the procedure, when your results will be ready.  Your health care provider may: ? Give you medicines for any pain. ? Monitor the insertion sites to make sure that bleeding stops. Summary  Electromyoneurogram is a test to check how well your muscles and nerves are working.  If the results of the tests are abnormal, this may indicate disease or injury.  This is a safe procedure. However, problems may occur, such as bleeding and infection.  Your health care provider will do two tests to complete this procedure. One checks your muscles (EMG) and another checks your nerves (NCS).  It is up to you to get the results of your procedure. Ask your health care provider, or the department that is doing the procedure, when your results will be ready. This information is not intended to replace advice given to you by your health care provider. Make sure you discuss any questions you have with your health care provider. Document Revised: 12/24/2017 Document Reviewed: 12/06/2017 Elsevier Patient Education  [place].

## 2019-05-20 NOTE — Progress Notes (Signed)
[facility]    Provider:  Dr Jaynee Eagles Referring Provider: ED Primary Care Physician:  Melvin Jordan, MD  CC:  Facial numbness  Interval update 05/20/2019: Patient was initially seen in November 2019 for facial numbness, he was seen again in our office in 2020 with the nurse practitioner for follow-up, he initially presented with facial arm and leg numbness lasting over 24 hours however negative MRI, differential included TIA, migraine with aura, he was also diagnosed with moderately severe left ulnar neuropathy and bilateral moderate to severe carpal tunnel syndrome on EMG nerve conduction study which he chose not to address surgically but with conservative measures.  We had recommended possibly changing from aspirin to Plavix, continuing on pravastatin, and management of all stroke risk factors.  When he was seen for follow-up he was feeling well with no concerns.  He has a past medical history of hypertension, hyperlipidemia, diabetes and hepatitis C.  He has had a complete work-up including stroke work-up, multiple imaging, CTA head and neck in the past all without etiology. CTA did see Additional findings: Cervical degenerative disc disease most pronounced at C5-C6 and C6-C7 and we may want to image his neck since he has had more episodes.  I reviewed recent records, he presented to the emergency room February 26, 2019 for numbness, left arm and left leg, he woke up with symptoms, tingling sensation of his left arm and left leg relatively constant throughout the day, no difficulty walking, no dizziness, no with vision changes, no weakness, nearly identical to his episode in the past, no neuro deficit was identified on examination, they did perform an MRI of the brain that day which showed no acute changes.  He was seen again in December for ongoing symptoms intermittently most prevalent in his left arm starting at the elbow with tingling radiating down his fourth and fifth digit,  some intermittent tingling and paresthesias along the lateral portion of his left leg going down from his knee which is less persistent.  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  Examination was consistent with this.  He was seen again in December 2020 for left-sided numbness and chest pain but also reported sensory changes of the face, he was diagnosed with anxiety over the prior week.  He was also seen in early January for dizziness and headache. Diagnosed with Covid 11/25.   He says he is having new symptoms, he feels he is weak, lost 20+ pounds, they were trying to do keto in October and he immediately lost weight, nothing drastic, he continues to lose weight without trying. It has been discussed with Dr. Stephanie Acre and she doesn't know. He feels fatigued. He stopped sleeping and has sleep deprivation and he has been put on Ambien and also SSRIs, all started in October when started the keto diet for a month but they stopped. They do not associate this with Covid in November. He has tinglin gin the legs bilaterally when laying in bed or sitting, both legs are symmetric, not when walking, in the lower legs on the sides and in the arms symmetrically as well. Digits 4-5 of toes feel numb. Symptoms in the legs started after weight loss.    HPI:  Melvin Fisher is a 64 y.o. male here as requested by Dr. Stephanie Acre for facial numbness. PMHx hld, HTN, CP (echo normal  in 2018 per Dr. Gwenlyn Found), diabetes, hep C. Here with his wife who also provides information. It was 5pm, he threw his arm back around his wife and the fingers started tingling, he sometimes has tingling in the left hand, sometimes at night especially if laying on it. Sometimes he has heart palpitations (he has been evaluated), heart starts beating an that's when he feel dizziness and some left arm tingling.  When he had the mori he was having symptoms for 24 hours and MRi was negative. He takes a baby aspirin and manages his other risk factors. He does a lot of lifting and a lot of manual work. +snoring. Frequent awakenings.   Reviewed notes, labs and imaging from outside physicians, which showed:  Reviewed emergency room notes patient was seen March 03, 2018, having tingling in his left arm and feeling numbness.  No active distress at the time he was seen in the emergency room.  It began the prior day around 5 PM while he was watching TV, possibly better with positional changes, no shortness of breath, not worse with movements or extension, no trauma falls or neck pain, no fevers or cough, he did have a mild dull headache and felt like throbbing in the left leg mild tingling in the left leg, no facial droop left side of his lip was with tingling.  Exam was unremarkable except for review of systems numbness and headaches.  Physical exam including neurological exam were normal.  EKG without acute changes.  Cardiac work-up was performed.  Negative stress test last year.  Follow-up with Dr. Alvester Chou.  Neurologic exam was normal except some paresthesias in the left arm.  MRI of the brain was ordered for CVA. No hx of seizures. He also talks about headache behind the left eye pressure and light sensitivity.   Personally reviewed MRI of the brain images which were normal.  BUN 19, creatinine 1.26  Review of Systems: Patient complains of symptoms per HPI as well as the following symptoms: tinglig. Pertinent negatives and positives per HPI. All others negative.   Social History   Socioeconomic History  . Marital status: Married    Spouse name: Not on file  . Number of children: 2  . Years of education: Not on file  . Highest education level: High school graduate  Occupational History  . Not on file  Tobacco Use  . Smoking status: Never Smoker  . Smokeless tobacco: Never Used  Substance and Sexual  Activity  . Alcohol use: Yes    Comment: occasional  . Drug use: No  . Sexual activity: Not on file  Other Topics Concern  . Not on file  Social History Narrative   Lives at home with wife & daughter   Left handed   Caffeine: none   Social Determinants of Health   Financial Resource Strain:   . Difficulty of Paying Living Expenses: Not on file  Food Insecurity:   . Worried About Charity fundraiser in the Last Year: Not on file  . Ran Out of Food in the Last Year: Not on file  Transportation Needs:   . Lack of Transportation (Medical): Not on file  . Lack of Transportation (Non-Medical): Not on file  Physical Activity:   . Days of Exercise per Week: Not on file  . Minutes of Exercise per Session: Not on file  Stress:   . Feeling of Stress : Not on file  Social Connections:   . Frequency of Communication with Friends  and Family: Not on file  . Frequency of Social Gatherings with Friends and Family: Not on file  . Attends Religious Services: Not on file  . Active Member of Clubs or Organizations: Not on file  . Attends Archivist Meetings: Not on file  . Marital Status: Not on file  Intimate Partner Violence:   . Fear of Current or Ex-Partner: Not on file  . Emotionally Abused: Not on file  . Physically Abused: Not on file  . Sexually Abused: Not on file    Family History  Problem Relation Age of Onset  . Hypertension Mother   . Cancer - Lung Father   . Hypertension Sister   . Hypertension Sister   . Diabetes Mellitus II Maternal Grandmother   . Neuropathy Maternal Uncle     Past Medical History:  Diagnosis Date  . Anxiety   . BPH (benign prostatic hyperplasia)   . Diverticulosis   . Eczema   . Hemangioma of liver   . Hepatitis C   . Hypertension   . Palpitations     Past Surgical History:  Procedure Laterality Date  . CARPAL TUNNEL RELEASE    . ROTATOR CUFF REPAIR      Current Outpatient Medications  Medication Sig Dispense Refill  .  aspirin EC 81 MG tablet Take 81 mg by mouth daily.    Marland Kitchen doxepin (SINEQUAN) 10 MG capsule Take 20 mg by mouth at bedtime.    Marland Kitchen lisinopril (PRINIVIL,ZESTRIL) 20 MG tablet Take 10 mg by mouth daily.     . meloxicam (MOBIC) 15 MG tablet Take 15 mg by mouth daily as needed for pain.     . mirtazapine (REMERON SOL-TAB) 15 MG disintegrating tablet Take 15 mg by mouth at bedtime.    . pravastatin (PRAVACHOL) 20 MG tablet Take 20 mg by mouth every other day.     No current facility-administered medications for this visit.    Allergies as of 05/20/2019  . (No Known Allergies)    Vitals: BP 104/63 (BP Location: Right Arm, Patient Position: Sitting)   Pulse 87   Temp (!) 97.3 F (36.3 C) Comment: taken at front; wife 97.5  Ht 5' 10"  (1.778 m)   Wt 183 lb (83 kg)   BMI 26.26 kg/m  Last Weight:  Wt Readings from Last 1 Encounters:  05/20/19 183 lb (83 kg)   Last Height:   Ht Readings from Last 1 Encounters:  05/20/19 5' 10"  (1.778 m)   Physical exam: Exam: Gen: NAD, conversant, well nourised, well groomed                     CV: RRR, no MRG. No Carotid Bruits. No peripheral edema, warm, nontender Eyes: Conjunctivae clear without exudates or hemorrhage MSK: Cool toes distally but normal pulses  Neuro: Detailed Neurologic Exam  Speech:    Speech is normal; fluent and spontaneous with normal comprehension.  Cognition:    The patient is oriented to person, place, and time;     recent and remote memory intact;     language fluent;     normal attention, concentration,     fund of knowledge Cranial Nerves:    The pupils are equal, round, and reactive to light.Attempted fundoscopy could not visualize. Visual fields are full to finger confrontation. Extraocular movements are intact. Trigeminal sensation is intact and the muscles of mastication are normal. The face is symmetric. The palate elevates in the midline. Hearing intact. Voice is  normal. Shoulder shrug is normal. The tongue has  normal motion without fasciculations.   Coordination:    Normal finger to nose and heel to shin. Normal rapid alternating movements.   Gait:    Heel-toe and tandem gait are normal.   Motor Observation:    No asymmetry, no atrophy, and no involuntary movements noted. Tone:    Normal muscle tone.    Posture:    Posture is normal. normal erect    Strength:    Strength is V/V in the upper and lower limbs.      Sensation: intact to LT     Reflex Exam:  DTR's:    Deep tendon reflexes in the upper and lower extremities are normal bilaterally.   Toes:    The toes are equiv bilaterally.   Clonus:    Clonus is absent.  Assessment/Plan:  64 y.o. male here as requested by ED for facial numbness. PMHx hld, HTN, CP (echo normal in 2018 per Dr. Gwenlyn Found), diabetes, (hep C?). Neuro exam normal.   Patient with recurrent left-sided arm and leg paresthesias(also new other symptoms see below), been to the emergency room multiple times, had stroke evaluation, multiple imaging, no evidence for stroke or pathology in the brain.  He does have diagnosed left ulnar neuropathy and the symptoms in his left arm are consistent with that.  The symptoms in his left leg appear to be in the peroneal nerve distribution, he is a [employer] driver and given this he may have ulnar neuropathy and peroneal neuropathy.  He is also been recently diagnosed with anxiety.  He says today, in addition the the above, he is having new symptoms, he feels he is weak, lost 20+ pounds, they were trying to do keto in October and he immediately lost weight, nothing drastic, he continues to lose weight without trying. He feels fatigued. They do not associate onset with Covid in November. He has tinglin gin the legs bilaterally when laying in bed or sitting, both legs are symmetric, not when walking, in the lower legs on the sides and in the arms symmetrically as well. Digits 4-5 of toes feel numb. Symptoms in the legs started after weight loss.  Also similar symptoms in the arms.   Patient is here with his wife, his neurologic exam is completely normal, this could be anxiety however given the constellation of symptoms I think he should have a thorough evaluation with blood work, MRI of the cervical spine to evaluate for myelopathy causing symptoms, and EMG nerve conduction study to ensure no neuromuscular disorder.  EMG nerve conduction study of the left arm and left leg specifically looking for muscle disorders, evaluate for ulnar neuropathy, carpal tunnel syndrome, cervical radiculopathy, in the left lower leg possibly peroneal neuropathy.  Continue stroke prevention with managing vascular risk factors, aspirin 81 mg and statin.  We may want to discontinue statin and see if this improves his symptoms.  I recommended in the past that he has his ulnar neuropathy evaluated for treatment, he is elected conservative management   - ESS 11, he snores, frequent awakenings - recommend sleep eval in past, they decline for now, discussed sequelae of untreated OSA including stroke  -Given his unintentional weight loss I would recommend he follow-up with his PCP Alessandra Grout for possibly pan scanning to see if there is any malignancies however I would leave this decision to Dr. Cheron Schaumann clinical judgment.   For anything concerning or acute symptoms please proceed see to the emergency room  or call 911.  Orders Placed This Encounter  Procedures  . MR CERVICAL SPINE WO CONTRAST  . Hepatitis c antibody (reflex)  . Hemoglobin A1c  . B. burgdorfi Antibody  . Vitamin B1  . Methylmalonic acid, serum  . Vitamin B6  . CK  . Magnesium  . TSH  . HIV Antibody (routine testing w rflx)  . Sedimentation rate  . ANA w/Reflex  . Sjogren's syndrome antibods(ssa + ssb)  . B12 and Folate Panel  . RPR  . Rheumatoid factor  . Heavy metals, blood  . Multiple Myeloma Panel (SPEP&IFE w/QIG)  . CBC  . Comprehensive metabolic panel  . Cryoglobulin  .  NCV with EMG(electromyography)    [name], MD  [facility] [address] [place], [place] [address]  Phone [phone] Fax [phone]  A total of 80 minutes was spent on this patient's care, reviewing imaging, past records, recent hospitalization notes and results. Over half this time was spent on counseling patient on the  1. Numbness and tingling of both legs below knees   2. Numbness and tingling of both upper extremities   3. Unintentional weight loss   4. Chronic fatigue   5. Numbness   6. Weakness   7. Atrophy of muscle of multiple sites   8. Elevated serum glucose   9. 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

## 2019-05-21 NOTE — Progress Notes (Signed)
Still awaiting some labs but his  Hepatitis C is NEGATIVE. Great news, he will be thrilled just give him a call thanks Dr. Jaynee Eagles

## 2019-05-25 NOTE — Progress Notes (Signed)
Other than pre-diabetes, so far labs look fine. thanks

## 2019-05-28 LAB — MULTIPLE MYELOMA PANEL, SERUM
Albumin SerPl Elph-Mcnc: 3.7 g/dL (ref 2.9–4.4)
Albumin/Glob SerPl: 1.4 (ref 0.7–1.7)
Alpha 1: 0.2 g/dL (ref 0.0–0.4)
Alpha2 Glob SerPl Elph-Mcnc: 0.7 g/dL (ref 0.4–1.0)
B-Globulin SerPl Elph-Mcnc: 0.8 g/dL (ref 0.7–1.3)
Gamma Glob SerPl Elph-Mcnc: 1 g/dL (ref 0.4–1.8)
Globulin, Total: 2.7 g/dL (ref 2.2–3.9)
IgA/Immunoglobulin A, Serum: 159 mg/dL (ref 61–437)
IgG (Immunoglobin G), Serum: 992 mg/dL (ref 603–1613)
IgM (Immunoglobulin M), Srm: 45 mg/dL (ref 20–172)

## 2019-05-28 LAB — CBC
Hematocrit: 43.7 % (ref 37.5–51.0)
Hemoglobin: 14.6 g/dL (ref 13.0–17.7)
MCH: 30 pg (ref 26.6–33.0)
MCHC: 33.4 g/dL (ref 31.5–35.7)
MCV: 90 fL (ref 79–97)
Platelets: 193 10*3/uL (ref 150–450)
RBC: 4.87 x10E6/uL (ref 4.14–5.80)
RDW: 12.1 % (ref 11.6–15.4)
WBC: 6.9 10*3/uL (ref 3.4–10.8)

## 2019-05-28 LAB — COMPREHENSIVE METABOLIC PANEL
ALT: 8 IU/L (ref 0–44)
AST: 11 IU/L (ref 0–40)
Albumin/Globulin Ratio: 1.8 (ref 1.2–2.2)
Albumin: 4.1 g/dL (ref 3.8–4.8)
Alkaline Phosphatase: 67 IU/L (ref 39–117)
BUN/Creatinine Ratio: 13 (ref 10–24)
BUN: 14 mg/dL (ref 8–27)
Bilirubin Total: 0.5 mg/dL (ref 0.0–1.2)
CO2: 28 mmol/L (ref 20–29)
Calcium: 9.8 mg/dL (ref 8.6–10.2)
Chloride: 103 mmol/L (ref 96–106)
Creatinine, Ser: 1.1 mg/dL (ref 0.76–1.27)
GFR calc Af Amer: 82 mL/min/{1.73_m2} (ref >59)
GFR calc non Af Amer: 71 mL/min/{1.73_m2} (ref >59)
Globulin, Total: 2.3 g/dL (ref 1.5–4.5)
Glucose: 94 mg/dL (ref 65–99)
Potassium: 5 mmol/L (ref 3.5–5.2)
Sodium: 141 mmol/L (ref 134–144)
Total Protein: 6.4 g/dL (ref 6.0–8.5)

## 2019-05-28 LAB — SJOGREN'S SYNDROME ANTIBODS(SSA + SSB)
ENA SSA (RO) Ab: <0.2 AI (ref 0.0–0.9)
ENA SSB (LA) Ab: <0.2 AI (ref 0.0–0.9)

## 2019-05-28 LAB — HEMOGLOBIN A1C
Est. average glucose Bld gHb Est-mCnc: 120 mg/dL
Hgb A1c MFr Bld: 5.8 % — ABNORMAL HIGH (ref 4.8–5.6)

## 2019-05-28 LAB — B. BURGDORFI ANTIBODIES: Lyme IgG/IgM Ab: <0.91 {ISR} (ref 0.00–0.90)

## 2019-05-28 LAB — HEAVY METALS, BLOOD
Arsenic: 10 ug/L (ref 2–23)
Lead, Blood: <1 ug/dL (ref 0–4)
Mercury: 1.3 ug/L (ref 0.0–14.9)

## 2019-05-28 LAB — HIV ANTIBODY (ROUTINE TESTING W REFLEX): HIV Screen 4th Generation wRfx: NONREACTIVE

## 2019-05-28 LAB — B12 AND FOLATE PANEL
Folate: 16.5 ng/mL (ref >3.0)
Vitamin B-12: 828 pg/mL (ref 232–1245)

## 2019-05-28 LAB — VITAMIN B6: Vitamin B6: 10 ug/L (ref 5.3–46.7)

## 2019-05-28 LAB — CK: Total CK: 69 U/L (ref 41–331)

## 2019-05-28 LAB — METHYLMALONIC ACID, SERUM: Methylmalonic Acid: 156 nmol/L (ref 0–378)

## 2019-05-28 LAB — RPR: RPR Ser Ql: NONREACTIVE

## 2019-05-28 LAB — RHEUMATOID FACTOR: Rheumatoid fact SerPl-aCnc: <10 IU/mL (ref 0.0–13.9)

## 2019-05-28 LAB — HEPATITIS C ANTIBODY (REFLEX): HCV Ab: <0.1 s/co ratio (ref 0.0–0.9)

## 2019-05-28 LAB — VITAMIN B1

## 2019-05-28 LAB — HCV COMMENT:

## 2019-05-28 LAB — CRYOGLOBULIN

## 2019-05-28 LAB — ANA W/REFLEX: Anti Nuclear Antibody (ANA): NEGATIVE

## 2019-05-28 LAB — SEDIMENTATION RATE: Sed Rate: 2 mm/hr (ref 0–30)

## 2019-05-28 LAB — MAGNESIUM: Magnesium: 2 mg/dL (ref 1.6–2.3)

## 2019-05-28 LAB — TSH: TSH: 0.71 u[IU]/mL (ref 0.450–4.500)

## 2019-06-01 ENCOUNTER — Telehealth: Payer: Self-pay | Admitting: Neurology

## 2019-06-01 NOTE — Telephone Encounter (Signed)
[facility] pt has [facility] they will reach out to the patient to schedule.

## 2019-07-02 ENCOUNTER — Encounter: Payer: BC Managed Care – PPO | Admitting: Neurology

## 2019-07-16 ENCOUNTER — Encounter: Payer: Self-pay | Admitting: *Deleted

## 2019-07-21 ENCOUNTER — Encounter: Payer: Self-pay | Admitting: Medical Oncology

## 2019-07-21 NOTE — Progress Notes (Signed)
I called pt to introduce myself as the Prostate Nurse Navigator and the Coordinator of the [facility].  1. I confirmed with the patient he is aware of his referral to the clinic 07/31/2019, arriving at 8:00 am.  2. I discussed the format of the clinic and the physicians he will be seeing that day.  3. I discussed where the clinic is located, registration and the COVID restrictions. I provided my contact information.  4. I confirmed his address and informed him I would be mailing a packet of information and forms to be completed. I asked him to bring them with him the day of his appointment.   He voiced understanding of the above. I asked him to call me if he has any questions or concerns regarding his appointments or the forms he needs to complete.

## 2019-07-27 ENCOUNTER — Encounter: Payer: Self-pay | Admitting: Medical Oncology

## 2019-07-30 ENCOUNTER — Encounter: Payer: Self-pay | Admitting: Medical Oncology

## 2019-07-30 NOTE — Progress Notes (Signed)
[facility] Location of Tumor / Histology: prostatic adenocarcinoma  If Prostate Cancer, Gleason Score is (3 + 3) and PSA is (3.45) on 01/2019. Prostate volume: 49.75 grams.  OLANDA CARUFEL was last seen by Dr. Jeffie Pollock in 2013. He was referred back recently for further evaluation of his rising PSA.   01/2019 PSA  3.45 2019  PSA  2.74 2018  PSA  2.7  Biopsies of prostate (if applicable) revealed:   Past/Anticipated interventions by urology, if any: prostate biopsy, referral to [facility]  Past/Anticipated interventions by medical oncology, if any: no  Weight changes, if any: yes  Bowel/Bladder complaints, if any: IPSS 19. SHIM 22. Reports nocturia x 2. Some urinary urgency. Having to strain to void intermittently. Denies dysuria or hematuria. Reports pain in his testes six months ago but none since. Reports nocturia.   Nausea/Vomiting, if any: denies  Pain issues, if any:  denies  SAFETY ISSUES:  Prior radiation? denies  Pacemaker/ICD? denies  Possible current pregnancy? no, male patient  Is the patient on methotrexate? denies  Current Complaints / other details:  64 year old male. Married with one son and one daughter. Patient's father has a hx of lung ca.

## 2019-07-30 NOTE — Progress Notes (Signed)
Spoke with patient to confirm appointment for [facility] 4/9, arriving at 8 am. I reviewed location, registration and COVID protocols. I reminded him to bring his completed medical forms. He voiced understanding of the above.

## 2019-07-31 ENCOUNTER — Encounter: Payer: Self-pay | Admitting: Radiation Oncology

## 2019-07-31 ENCOUNTER — Ambulatory Visit
Admission: RE | Admit: 2019-07-31 | Discharge: 2019-07-31 | Disposition: A | Discharge status: Home / Self Care | Payer: BC Managed Care – PPO | Source: Ambulatory Visit | Attending: Radiation Oncology | Admitting: Radiation Oncology

## 2019-07-31 ENCOUNTER — Other Ambulatory Visit: Payer: Self-pay

## 2019-07-31 ENCOUNTER — Encounter: Payer: Self-pay | Admitting: Medical Oncology

## 2019-07-31 ENCOUNTER — Inpatient Hospital Stay: Payer: BC Managed Care – PPO | Attending: Oncology | Admitting: Oncology

## 2019-07-31 VITALS — BP 118/86 | HR 82 | Temp 97.8°F | Resp 20 | Ht 70.0 in | Wt 197.0 lb

## 2019-07-31 DIAGNOSIS — Z191 Hormone sensitive malignancy status: Secondary | ICD-10-CM | POA: Insufficient documentation

## 2019-07-31 DIAGNOSIS — Z79899 Other long term (current) drug therapy: Secondary | ICD-10-CM | POA: Insufficient documentation

## 2019-07-31 DIAGNOSIS — C61 Malignant neoplasm of prostate: Secondary | ICD-10-CM | POA: Diagnosis not present

## 2019-07-31 DIAGNOSIS — I10 Essential (primary) hypertension: Secondary | ICD-10-CM | POA: Insufficient documentation

## 2019-07-31 DIAGNOSIS — Z803 Family history of malignant neoplasm of breast: Secondary | ICD-10-CM | POA: Diagnosis not present

## 2019-07-31 DIAGNOSIS — Z7982 Long term (current) use of aspirin: Secondary | ICD-10-CM | POA: Insufficient documentation

## 2019-07-31 DIAGNOSIS — E785 Hyperlipidemia, unspecified: Secondary | ICD-10-CM | POA: Insufficient documentation

## 2019-07-31 DIAGNOSIS — F419 Anxiety disorder, unspecified: Secondary | ICD-10-CM | POA: Insufficient documentation

## 2019-07-31 DIAGNOSIS — B1920 Unspecified viral hepatitis C without hepatic coma: Secondary | ICD-10-CM | POA: Diagnosis not present

## 2019-07-31 DIAGNOSIS — Z801 Family history of malignant neoplasm of trachea, bronchus and lung: Secondary | ICD-10-CM | POA: Diagnosis not present

## 2019-07-31 HISTORY — DX: Malignant neoplasm of prostate: C61

## 2019-07-31 NOTE — Consult Note (Signed)
Multi-Disciplinary Clinic     07/31/2019   --------------------------------------------------------------------------------   Melvin Fisher. Staples  MRN: [id number]  DOB: February 20, 1956, 64 year old Male  SSN: [id number]   PRIMARY CARE:  [name], MD  REFERRING:    PROVIDER:  Raynelle Bring, M.D.  LOCATION:  [facility] [phone]     --------------------------------------------------------------------------------   CC/HPI: CC: Prostate Cancer   Physician requesting consult: Dr. Irine Seal  PCP: Dr. [name]  Location of consult: [facility]   [name] is a 64 year old gentleman who was found to have an elevated PSA prompting a TRUS biopsy of the prostate on 06/25/19. This confirmed Gleason 3+3=6 adenocarcinoma of the prostate with 2 out of 12 biopsy cores positive for malignancy.   Family history: None.   Imaging studies: None.   PMH: He has a history of diabetes, hyperlipidemia, hypertension, anxiety, and depression.  PSH: No abdominal surgeries.   TNM stage: cT1c Nx Mx  PSA: 3.45  Gleason score: 3+3=6  Biopsy (06/25/19): 2/12 cores positive  Left: Benign  Right: R base (5%, 3+3=6), R lateral base (20%, 3+3=6, PNI)  Prostate volume: 49.8 cc  PSAD: 0.07   Nomogram  OC disease: 85%  EPE: 12%  SVI: 1%  LNI: 1%  PFS (5 year, 10 year): 96%, 93%   Urinary function: IPSS is 19.  Erectile function: SHIM score is 22.     ALLERGIES: No Allergies    MEDICATIONS: Doxepin Hcl  Lisinopril 20 mg tablet Oral  Meloxicam  Mirtazapine  Pravastatin Sodium     GU PSH: Prostate Needle Biopsy - 06/25/2019       PSH Notes: Shoulder Surgery, Hand Surgery   NON-GU PSH: Carpal tunnel surgery Rotator cuff surgery Surgical Pathology, Gross And Microscopic Examination For Prostate Needle - 06/25/2019     GU PMH: Prostate Cancer, T1c Nx Mx very low risk prostate cancer, but he does have perineural invasion which can suggest  a worse prognosis. I have discussed AS, RALP, EXRT, seeds in detail and cryo and HIFU briefly. I am going to have him seen by the [facility]. I did discuss considering a genomic test and early f/u MRI and rebiopsy if he is inclined to pursue surveillance. - 07/14/2019 Elevated PSA - 06/25/2019, He has a rising PSA and needs a biopsy. I have reviewed the risks of bleeding, infection and voiding difficulty. Levaquin sent. he will be scheduled next available. , - 05/25/2019 BPH w/LUTS, He has moderate LUTS and a benign exam. - 05/25/2019 Abdominal Pain Unspec, Abdominal pain of multiple sites - 2014 Nocturia, Nocturia - 2014 Testicular pain, unspecified, Testicular pain - 2014 Urinary Frequency, Increased urinary frequency - 2014    NON-GU PMH: Personal history of other diseases of the circulatory system, History of hypertension - 2014 Personal history of other endocrine, nutritional and metabolic disease, History of hypercholesterolemia - 2014 Anxiety Depression Diabetes Type 2 Encounter for general adult medical examination without abnormal findings, Encounter for preventive health examination Hypercholesterolemia Hypertension    FAMILY HISTORY: Hypertension - Mother Lung Cancer - Father   SOCIAL HISTORY: Marital Status: Married Preferred Language: English; Race: Black or African American Current Smoking Status: Patient has never smoked.   Tobacco Use Assessment Completed: Used Tobacco in last 30 days? Does not drink caffeine.     Notes: 1 son, 1 daughter    REVIEW OF SYSTEMS:    GU Review Male:   Patient denies frequent urination, hard to postpone urination, burning/  pain with urination, get up at night to urinate, leakage of urine, stream starts and stops, trouble starting your streams, and have to strain to urinate .  Gastrointestinal (Lower):   Patient denies diarrhea and constipation.  Gastrointestinal (Upper):   Patient denies nausea and vomiting.  Constitutional:   Patient denies fever,  night sweats, weight loss, and fatigue.  Skin:   Patient denies skin rash/ lesion and itching.  Eyes:   Patient denies double vision and blurred vision.  Ears/ Nose/ Throat:   Patient denies sore throat and sinus problems.  Hematologic/Lymphatic:   Patient denies swollen glands and easy bruising.  Cardiovascular:   Patient denies leg swelling and chest pains.  Respiratory:   Patient denies cough and shortness of breath.  Endocrine:   Patient denies excessive thirst.  Musculoskeletal:   Patient denies back pain and joint pain.  Neurological:   Patient denies headaches and dizziness.  Psychologic:   Patient denies depression and anxiety.   VITAL SIGNS: None   MULTI-SYSTEM PHYSICAL EXAMINATION:    Constitutional: Well-nourished. No physical deformities. Normally developed. Good grooming.     Complexity of Data:  Source Of History:  Patient  Lab Test Review:   PSA  Records Review:   Pathology Reports, Previous Patient Records   10/29/05  PSA  Total PSA 1.46     PROCEDURES: None   ASSESSMENT:      ICD-10 Details  1 GU:   Prostate Cancer - C61    PLAN:           Document Letter(s):  Created for Patient: Clinical Summary         Notes:   1. Very low risk prostate cancer: I had a detailed discussion with Mr. Befort and his wife today. His case has been reviewed in the multidisciplinary conference earlier this morning any has had a chance to discuss his situation in detail with Dr. Alen Blew and Dr. Tammi Klippel earlier this morning as well. The patient was counseled about the natural history of prostate cancer and the standard treatment options that are available for prostate cancer. It was explained to him how his age and life expectancy, clinical stage, Gleason score, and PSA affect his prognosis, the decision to proceed with additional staging studies, as well as how that information influences recommended treatment strategies. We discussed the roles for active surveillance, radiation  therapy, surgical therapy, androgen deprivation, as well as ablative therapy options for the treatment of prostate cancer as appropriate to his individual cancer situation. We discussed the risks and benefits of these options with regard to their impact on cancer control and also in terms of potential adverse events, complications, and impact on quality of life particularly related to urinary and sexual function. The patient was encouraged to ask questions throughout the discussion today and all questions were answered to his stated satisfaction. In addition, the patient was provided with and/or directed to appropriate resources and literature for further education about prostate cancer and treatment options.   After reviewing options, he does understand the recommendation to proceed with active surveillance based on his very low risk disease. We did specifically address his concern regarding perineural invasion seen on his biopsy and have reviewed this in further detail with Dr. Orene Desanctis in pathology for confirmation. It was explained that this would not change recommendations to consider him for active surveillance considering his other disease parameters that are consistent with NCCN very low risk disease classification. He does feel comfortable proceeding with active surveillance at  this point. We discussed the recommendation to consider an MRI in 6 months with a subsequent confirmatory biopsy. He feels comfortable with this approach in all questions were answered to his stated satisfaction today.   CC: Dr. Alessandra Grout  Dr. Irine Seal  Dr. Tyler Pita  Dr. Zola Button    E & M CODES: We spent 58 minutes dedicated to evaluation and management time, including face to face interaction, discussions on coordination of care, documentation, result review, and discussion with others as applicable.

## 2019-07-31 NOTE — Progress Notes (Signed)
Radiation Oncology         [phone] ________________________________  Multidisciplinary Prostate Cancer Clinic  Initial Radiation Oncology Consultation  Name: Melvin Fisher MRN: [id number]  Date: 07/31/2019  DOB: 10-23-1955  CC:[name], MD  Irine Seal, MD   REFERRING PHYSICIAN: Irine Seal, MD  DIAGNOSIS: 64 y.o. gentleman with stage T1c adenocarcinoma of the prostate with a Gleason's score of 3+3 and a PSA of 3.45    ICD-10-CM   1. Malignant neoplasm of prostate ([place])  C61     HISTORY OF PRESENT ILLNESS::Melvin Fisher is a 64 y.o. gentleman.  He was noted to have a rising PSA from 2.74 in 2019 to 3.45 in 2021 by his primary care physician, Dr. Stephanie Acre.  Accordingly, he was referred for evaluation in urology by Dr. Jeffie Pollock on 05/25/2019,  digital rectal examination was performed at that time revealing no nodules.  The patient proceeded to transrectal ultrasound with 12 biopsies of the prostate on 06/25/2019.  The prostate volume measured 49.75 cc.  Out of 12 core biopsies, 2 were positive.  The maximum Gleason score was 3+3, and this was seen in right base lateral and right base, both of which also showed PNI.  The patient reviewed the biopsy results with his urologist and he has kindly been referred today to the multidisciplinary prostate cancer clinic for presentation of pathology and radiology studies in our conference for discussion of potential radiation treatment options and clinical evaluation.    PREVIOUS RADIATION THERAPY: No  PAST MEDICAL HISTORY:  has a past medical history of Anxiety, BPH (benign prostatic hyperplasia), Diverticulosis, Eczema, Hemangioma of liver, Hepatitis C, Hypertension, Palpitations, and Prostate cancer ([place]).    PAST SURGICAL HISTORY: Past Surgical History:  Procedure Laterality Date  . CARPAL TUNNEL RELEASE    . PROSTATE BIOPSY    . ROTATOR CUFF REPAIR      FAMILY HISTORY: family history includes Breast cancer in his  maternal grandmother; Cancer - Lung in his father and sister; Diabetes Mellitus II in his maternal grandmother; Hypertension in his mother, sister, and sister; Neuropathy in his maternal uncle; Prostate cancer in his maternal uncle.  SOCIAL HISTORY:  reports that he has never smoked. He has never used smokeless tobacco. He reports current alcohol use. He reports that he does not use drugs.  ALLERGIES: Patient has no known allergies.  MEDICATIONS:  Current Outpatient Medications  Medication Sig Dispense Refill  . doxepin (SINEQUAN) 25 MG capsule Take 25 mg by mouth at bedtime.    Marland Kitchen lisinopril (PRINIVIL,ZESTRIL) 20 MG tablet Take 10 mg by mouth daily.     . mirtazapine (REMERON) 30 MG tablet Take 30 mg by mouth at bedtime.    . pravastatin (PRAVACHOL) 20 MG tablet Take 20 mg by mouth every other day.     No current facility-administered medications for this encounter.    REVIEW OF SYSTEMS:  On review of systems, the patient reports that he is doing well overall. He denies any chest pain, shortness of breath, cough, fevers, chills, night sweats, unintended weight changes. He denies any bowel disturbances, and denies abdominal pain, nausea or vomiting. He denies any new musculoskeletal or joint aches or pains. His IPSS was 19, indicating moderate urinary symptoms. He reports having to strain with urination, as well as some weak stream, urinary urgency, and frequency. His SHIM was 22, indicating he does not have erectile dysfunction. A complete review of systems is obtained and is otherwise negative.   PHYSICAL EXAM:  Wt  Readings from Last 3 Encounters:  07/31/19 197 lb (89.4 kg)  05/20/19 183 lb (83 kg)  03/25/19 195 lb (88.5 kg)   Temp Readings from Last 3 Encounters:  07/31/19 97.8 F (36.6 C)  05/20/19 (!) 97.3 F (36.3 C)  05/01/19 98.1 F (36.7 C) (Oral)   BP Readings from Last 3 Encounters:  07/31/19 118/86  05/20/19 104/63  05/01/19 (!) 151/109   Pulse Readings from Last 3  Encounters:  07/31/19 82  05/20/19 87  05/01/19 80   Pain Assessment Pain Score: 0-No pain/10  In general this is a well appearing African American male in no acute distress. He is alert and oriented x4 and appropriate throughout the examination. HEENT reveals that the patient is normocephalic, atraumatic. EOMs are intact. PERRLA. Skin is intact without any evidence of gross lesions. Cardiovascular exam reveals a regular rate and rhythm, no clicks rubs or murmurs are auscultated. Chest is clear to auscultation bilaterally. Lymphatic assessment is performed and does not reveal any adenopathy in the cervical, supraclavicular, axillary, or inguinal chains. Abdomen has active bowel sounds in all quadrants and is intact. The abdomen is soft, non tender, non distended. Lower extremities are negative for pretibial pitting edema, deep calf tenderness, cyanosis or clubbing.  KPS = 90  100 - Normal; no complaints; no evidence of disease. 90   - Able to carry on normal activity; minor signs or symptoms of disease. 80   - Normal activity with effort; some signs or symptoms of disease. 88   - Cares for self; unable to carry on normal activity or to do active work. 60   - Requires occasional assistance, but is able to care for most of his personal needs. 50   - Requires considerable assistance and frequent medical care. 36   - Disabled; requires special care and assistance. 67   - Severely disabled; hospital admission is indicated although death not imminent. 36   - Very sick; hospital admission necessary; active supportive treatment necessary. 10   - Moribund; fatal processes progressing rapidly. 0     - Dead  Karnofsky DA, Abelmann [place], Craver LS and Burchenal [facility] [phone]) The use of the nitrogen mustards in the palliative treatment of carcinoma: with particular reference to bronchogenic carcinoma Cancer 1 634-56   LABORATORY DATA:  Lab Results  Component Value Date   WBC 6.9 05/20/2019   HGB 14.6  05/20/2019   HCT 43.7 05/20/2019   MCV 90 05/20/2019   PLT 193 05/20/2019   Lab Results  Component Value Date   NA 141 05/20/2019   K 5.0 05/20/2019   CL 103 05/20/2019   CO2 28 05/20/2019   Lab Results  Component Value Date   ALT 8 05/20/2019   AST 11 05/20/2019   ALKPHOS 67 05/20/2019   BILITOT 0.5 05/20/2019     RADIOGRAPHY: No results found.    IMPRESSION/PLAN: 64 y.o. gentleman with Stage T1c adenocarcinoma of the prostate with a PSA of 3.45 and a Gleason score of 3+3.    We discussed the patient's workup and outlined the nature of prostate cancer in this setting. The patient's T stage, Gleason's score, and PSA put him into the very low risk group. Accordingly, he is eligible for a variety of potential treatment options including brachytherapy, 5.5 weeks of external radiation, prostatectomy, or active surveillance.  The consensus recommendation is to proceed with active surveillance with close monitoring of the PSA and periodic repeat prostate biopsies, likely with prostate MRI to allow a  more in depth assessment and more targeted biopsy sampling.  Should there be any significant changes in his PSA or indication of disease progression on future imaging studies and/or biopsy, the recommendation would be to proceed with definitive treatment at that time.  We discussed the available radiation techniques, and focused on the details and logistics of delivery. We discussed and outlined the risks, benefits, short and long-term effects associated with radiotherapy and compared and contrasted these with prostatectomy. We discussed the role of SpaceOAR in reducing the rectal toxicity associated with radiotherapy.  He and his wife were encouraged to ask questions and were answered to their stated satisfaction.  At the conclusion of our conversation, the patient reports that he is comfortable proceeding with active surveillance.  He will meet with Dr. Alinda Money and Dr. Alen Blew to answer any additional  questions prior to making a final decision and will plan to follow up with Dr. Jeffie Pollock in the near future.  We will share our discussion with Dr. Jeffie Pollock so that he can move forward with developing and scheduling an appropriate plan for active surveillance.  We enjoyed meeting him and his wife today and look forward to continuing to follow along his progress.  We would be more than happy to continue to participate in his care in the future should there be any indication of disease progression warranting treatment.  He knows that he is welcome to call at anytime with any questions or concerns.     Nicholos Johns, PA-C    Tyler Pita, MD  [facility] Direct Dial: [phone]  Fax: [phone] [email or web address]  Skype  [email or web address]   This document serves as a record of services personally performed by Tyler Pita, MD and Freeman Caldron, PA-C. It was created on their behalf by Wilburn Mylar, a trained medical scribe. The creation of this record is based on the scribe's personal observations and the provider's statements to them. This document has been checked and approved by the attending provider.

## 2019-07-31 NOTE — Progress Notes (Signed)
Reason for the request:    Prostate cancer  HPI: I was asked by Melvin Fisher to evaluate Melvin Fisher for the diagnosis of prostate cancer.  He is a 64 year old man with history of hypertension and hyperlipidemia was found to have an elevated PSA of 3.45.  He has developed urinary symptoms including nocturia as well as urgency but no dysuria or hematuria.  He was evaluated by Dr. Jeffie Fisher underwent a biopsy March 4 of 2021.  The biopsy showed prostate adenocarcinoma involving 2 cores with Gleason score 3+3 = 6.  He had 20% and 1 core and 5%.  Clinically, he reports no new urinary symptoms at this time.  Continues to be active and attends activities of daily living without any decline.  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  He does not report any headaches, blurry vision, syncope or seizures. Does not report any fevers, chills or sweats.  Does not report any cough, wheezing or hemoptysis.  Does not report any chest pain, palpitation, orthopnea or leg edema.  Does not report any nausea, vomiting or abdominal pain.  Does not report any constipation or diarrhea.  Does not report any skeletal complaints.    Does not report frequency, urgency or hematuria.  Does not report any skin rashes or lesions. Does not report any heat or cold intolerance.  Does not report any lymphadenopathy or petechiae.  Does not report any anxiety or depression.  Remaining review of systems is negative.    Past Medical History:  Diagnosis Date  . Anxiety   . BPH (benign prostatic hyperplasia)   . Diverticulosis   . Eczema   . Hemangioma of liver   . Hepatitis C   . Hypertension   . Palpitations   :  Past Surgical History:  Procedure Laterality Date  . CARPAL TUNNEL RELEASE    . ROTATOR CUFF REPAIR    :   Current Outpatient Medications:  .  aspirin EC 81 MG tablet, Take 81 mg by mouth daily., Disp: , Rfl:  .  doxepin (SINEQUAN) 10 MG capsule, Take 20 mg by mouth at bedtime., Disp: , Rfl:  .  lisinopril  (PRINIVIL,ZESTRIL) 20 MG tablet, Take 10 mg by mouth daily. , Disp: , Rfl:  .  meloxicam (MOBIC) 15 MG tablet, Take 15 mg by mouth daily as needed for pain. , Disp: , Rfl:  .  mirtazapine (REMERON SOL-TAB) 15 MG disintegrating tablet, Take 15 mg by mouth at bedtime., Disp: , Rfl:  .  pravastatin (PRAVACHOL) 20 MG tablet, Take 20 mg by mouth every other day., Disp: , Rfl: :  No Known Allergies:  Family History  Problem Relation Age of Onset  . Hypertension Mother   . Cancer - Lung Father   . Hypertension Sister   . Hypertension Sister   . Diabetes Mellitus II Maternal Grandmother   . Neuropathy Maternal Uncle   :  Social History   Socioeconomic History  . Marital status: Married    Spouse name: Not on file  . Number of children: 2  . Years of education: Not on file  . Highest education level: High school graduate  Occupational History  . Not on file  Tobacco Use  . Smoking status: Never Smoker  . Smokeless tobacco: Never Used  Substance and Sexual Activity  . Alcohol use: Yes    Comment: occasional  . Drug use: No  . Sexual activity: Not on file  Other Topics Concern  . Not on file  Social  History Narrative   Lives at home with wife & daughter   Left handed   Caffeine: none   Social Determinants of Health   Financial Resource Strain:   . Difficulty of Paying Living Expenses:   Food Insecurity:   . Worried About Charity fundraiser in the Last Year:   . Arboriculturist in the Last Year:   Transportation Needs:   . Film/video editor (Medical):   Marland Kitchen Lack of Transportation (Non-Medical):   Physical Activity:   . Days of Exercise per Week:   . Minutes of Exercise per Session:   Stress:   . Feeling of Stress :   Social Connections:   . Frequency of Communication with Friends and Family:   . Frequency of Social Gatherings with Friends and Family:   . Attends Religious Services:   . Active Member of Clubs or Organizations:   . Attends Archivist  Meetings:   Marland Kitchen Marital Status:   Intimate Partner Violence:   . Fear of Current or Ex-Partner:   . Emotionally Abused:   Marland Kitchen Physically Abused:   . Sexually Abused:   :  Pertinent items are noted in HPI.  Exam: ECOG 0 General appearance: alert and cooperative appeared without distress. Head: atraumatic without any abnormalities. Eyes: conjunctivae/corneas clear. PERRL.  Sclera anicteric. Throat: lips, mucosa, and tongue normal; without oral thrush or ulcers. Resp: clear to auscultation bilaterally without rhonchi, wheezes or dullness to percussion. Cardio: regular rate and rhythm, S1, S2 normal, no murmur, click, rub or gallop GI: soft, non-tender; bowel sounds normal; no masses,  no organomegaly Skin: Skin color, texture, turgor normal. No rashes or lesions Lymph nodes: Cervical, supraclavicular, and axillary nodes normal. Neurologic: Grossly normal without any motor, sensory or deep tendon reflexes. Musculoskeletal: No joint deformity or effusion.    Assessment and Plan:    64 year old man with prostate cancer diagnosed in March 2021.  He presented with a PSA of 3.45 and a Gleason score of 3+3 = 6 in 2 cores with low volume disease.  His case was discussed today in the prostate cancer multidisciplinary clinic.  This included review of his pathology reports with the reviewing pathologist.  He is disease represents a very low risk criteria and despite the perineural invasion does not necessitate any immediate treatment at this time.  I explained to him that the standard of care at this point remains active surveillance which includes periodic MRI imaging as well as repeat biopsy in the future.  Definitive therapy with radiation or radical cystectomy would be reasonable if he has more high-volume or higher risk disease detected in the future.  From a medical oncology standpoint his risk of developing metastatic disease or risk of dying from this prostate cancer remains very low and this  was explained to him today.  He appears to be in agreement at this time and understands that active surveillance does not equate to neglect and close monitoring would be mandatory.   45  minutes were dedicated to this visit. The time was spent on reviewing laboratory data, imaging studies, discussing treatment options, and answering questions regarding future plan.    A copy of this consult has been forwarded to the requesting physician.

## 2019-07-31 NOTE — Progress Notes (Signed)
                               Care Plan Summary  Name:  Melvin Fisher DOB: 24-Aug-1955   Your Medical Team:   Urologist -  Dr. Raynelle Bring, [facility]  Radiation Oncologist - Dr. Tyler Pita, [facility]   Medical Oncologist - Dr. Zola Button, [place]  Recommendations: 1) Active surveillance    * These recommendations are based on information available as of today's consult.      Recommendations may change depending on the results of further tests or exams.  Next Steps: 1) Follow up with Dr. Jeffie Pollock for MRI and repeat biopsy in 6 months    When appointments need to be scheduled, you will be contacted by [facility] and/or [facility].  Patient provided with business cards for all team members and a copy of "Fall Prevention Patient Safety Plan". Questions?  Please do not hesitate to call Cira Rue, RN, BSN, OCN at [phone] any questions or concerns.  Shirlean Mylar is your Oncology Nurse Navigator and is available to assist you while you're receiving your medical care at [facility].:

## 2019-08-03 ENCOUNTER — Encounter: Payer: Self-pay | Admitting: General Practice

## 2019-08-03 NOTE — Progress Notes (Signed)
[place] Psychosocial Distress Screening Spiritual Care  Met with Melvin Fisher and his wife Melvin Fisher in [facility] to introduce [place] team/resources, reviewing distress screen per protocol.  The patient scored a 8 on the Psychosocial Distress Thermometer which indicates severe distress. Also assessed for distress and other psychosocial needs.   ONCBCN DISTRESS SCREENING 08/03/2019  Screening Type Initial Screening  Distress experienced in past week (1-10) 8  Emotional problem type Nervousness/Anxiety  Physical Problem type Sleep/insomnia  Referral to support programs Yes   [name] reports reduced distress after meeting team and learning about treatment plan at [facility]. Introduced [place] (English) team and programming availability for patients as well as caregivers, normalizing value of creativity, playfulness, and connection.  Follow up needed: No. Per couple, no other needs at this time, but they are aware of ongoing team availability if needs arise or circumstances change.   [place], [place], [facility] Pager [phone] Voicemail [phone]

## 2019-08-10 ENCOUNTER — Encounter: Payer: Self-pay | Admitting: Medical Oncology

## 2019-08-10 NOTE — Progress Notes (Signed)
Spoke with patient as follow up to [facility] 4/9. He states he is doing well and found the clinic to be very helpful. He was so happy to hear he could continue active surveillance with MRI and biopsy in 6 months. He has not been scheduled for this appointment yet. I stressed the importance of making sure he follows up. He voiced understanding. I will follow up with patient in 3 months and if appointment, not made with Dr. Jeffie Pollock, I will call.  I asked him to call me if I can answer questions or assist him. He voiced understanding.

## 2019-12-23 DEATH — deceased

## 2020-04-09 ENCOUNTER — Ambulatory Visit: Payer: BC Managed Care – PPO | Attending: Internal Medicine

## 2020-04-09 DIAGNOSIS — Z23 Encounter for immunization: Secondary | ICD-10-CM

## 2020-04-09 NOTE — Progress Notes (Signed)
° °  Covid-19 Vaccination Clinic  Name:  JANSEL VONSTEIN    MRN: [id number] DOB: 1955/12/22  04/09/2020  Mr. Dehoyos was observed post Covid-19 immunization for 15 minutes without incident. He was provided with Vaccine Information Sheet and instruction to access the V-Safe system.   Mr. Hoffmeier was instructed to call 911 with any severe reactions post vaccine:  Difficulty breathing   Swelling of face and throat   A fast heartbeat   A bad rash all over body   Dizziness and weakness   Immunizations Administered    Name Date Dose VIS Date Route   Moderna Covid-19 Booster Vaccine 04/09/2020 10:52 AM 0.25 mL 02/10/2020 Intramuscular   Manufacturer: Levan Hurst   Lot: [id number]   [place]: [id number]

## 2020-05-25 ENCOUNTER — Other Ambulatory Visit: Payer: Self-pay | Admitting: Urology

## 2020-05-25 DIAGNOSIS — C61 Malignant neoplasm of prostate: Secondary | ICD-10-CM

## 2020-06-19 ENCOUNTER — Ambulatory Visit
Admission: RE | Admit: 2020-06-19 | Discharge: 2020-06-19 | Disposition: A | Discharge status: Home / Self Care | Payer: BC Managed Care – PPO | Source: Ambulatory Visit | Attending: Urology | Admitting: Urology

## 2020-06-19 DIAGNOSIS — C61 Malignant neoplasm of prostate: Secondary | ICD-10-CM

## 2020-06-19 MED ORDER — GADOBENATE DIMEGLUMINE 529 MG/ML IV SOLN
19.0000 mL | Freq: Once | INTRAVENOUS | Status: AC | PRN
  Administered 2020-06-19: 19 mL via INTRAVENOUS

## 2020-11-03 NOTE — Progress Notes (Signed)
Radiation Oncology         [phone] ________________________________  Outpatient Radiation Oncology Re-Consultation  Name: Melvin Fisher MRN: [id number]  Date: 11/04/2020  DOB: [birth year]  CC:Melvin Jordan, MD  Melvin Seal, MD   REFERRING PHYSICIAN: Irine Seal, MD  DIAGNOSIS: 65 y.o. gentleman with stage T1c adenocarcinoma of the prostate with a Gleason's score of 3+4 and a PSA of 5.61    ICD-10-CM   1. Malignant neoplasm of prostate ([place])  C61       HISTORY OF PRESENT ILLNESS::Melvin Fisher is a 89 y.o. gentleman.  We initially met with the patient in the multidisciplinary prostate cancer clinic in 07/2019 after he was noted to have a rising PSA from 2.74 in 2019 to 3.45 in 2021 by his primary care physician, Dr. Stephanie Acre.  The initial transrectal ultrasound with 12 biopsies of the prostate was performed by Dr. Jeffie Pollock on 06/25/2019.  The prostate volume measured 49.75 cc.  Out of 12 core biopsies, 2 were positive.  The maximum Gleason score was 3+3, and this was seen in right base lateral and right base, both of which also showed PNI.  After thorough discussion regarding his treatment options and the pros and cons of each, the patient elected to proceed with active surveillance.  He has continued in close follow-up since that time with serial PSA tests showing an increase up to 4.77 in July 2021.  The PSA subsequently decreased to 4.36 in October 2021 but most recently, had increased up to 5.61 as of April 2022.  He had an MRI of the prostate on 06/19/2020 that showed a PI-RADS 3 and 4 lesion in the right and left posterior lateral prostate.  DRE has remained without concerning findings.  He had an MR fusion biopsy of the prostate on 10/07/2020.  Out of 20 core biopsies, 8 were positive.  The maximum Gleason score was 3+4 and this was seen in 3 of the standard core biopsies in the right mid, right mid lateral and right base lateral as well as 3 out of 3 of the samples taken from  the ROI lesion on the right.  Additionally, Gleason 3+3 was seen in the right base and right apex lateral.  The patient reviewed the biopsy results with his urologist and given the evidence of progression from low risk Gleason 3+3 to favorable intermediate risk Gleason 3+4 adenocarcinoma the prostate, he has kindly been referred back to [place] today for further discussion of potential radiation treatment options.    PREVIOUS RADIATION THERAPY: No  PAST MEDICAL HISTORY:  has a past medical history of Anxiety, BPH (benign prostatic hyperplasia), Diverticulosis, Eczema, Hemangioma of liver, Hepatitis C, Hypertension, Palpitations, and Prostate cancer ([place]).    PAST SURGICAL HISTORY: Past Surgical History:  Procedure Laterality Date   CARPAL TUNNEL RELEASE     PROSTATE BIOPSY     ROTATOR CUFF REPAIR      FAMILY HISTORY: family history includes Breast cancer in his maternal grandmother; Cancer - Lung in his father and sister; Diabetes Mellitus II in his maternal grandmother; Hypertension in his mother, sister, and sister; Neuropathy in his maternal uncle; Prostate cancer in his maternal uncle.  SOCIAL HISTORY:  reports that he has never smoked. He has never used smokeless tobacco. He reports current alcohol use. He reports that he does not use drugs.  ALLERGIES: Patient has no known allergies.  MEDICATIONS:  Current Outpatient Medications  Medication Sig Dispense Refill   doxepin (SINEQUAN) 25 MG capsule Take 25  mg by mouth at bedtime.     lisinopril (PRINIVIL,ZESTRIL) 20 MG tablet Take 10 mg by mouth daily.      mirtazapine (REMERON) 30 MG tablet Take 30 mg by mouth at bedtime.     pravastatin (PRAVACHOL) 20 MG tablet Take 20 mg by mouth every other day.     No current facility-administered medications for this visit.    REVIEW OF SYSTEMS:  On review of systems, the patient reports that he is doing well overall. He denies any chest pain, shortness of breath, cough, fevers, chills, night  sweats, unintended weight changes. He denies any bowel disturbances, and denies abdominal pain, nausea or vomiting. He denies any new musculoskeletal or joint aches or pains. His IPSS was 15, indicating moderate urinary symptoms. He reports a weak stream, urinary urgency, and frequency with nocturia x2. His SHIM was 21, indicating he does not have erectile dysfunction. A complete review of systems is obtained and is otherwise negative.   PHYSICAL EXAM:  Wt Readings from Last 3 Encounters:  07/31/19 197 lb (89.4 kg)  05/20/19 183 lb (83 kg)  03/25/19 195 lb (88.5 kg)   Temp Readings from Last 3 Encounters:  07/31/19 97.8 F (36.6 C)  05/20/19 (!) 97.3 F (36.3 C)  05/01/19 98.1 F (36.7 C) (Oral)   BP Readings from Last 3 Encounters:  07/31/19 118/86  05/20/19 104/63  05/01/19 (!) 151/109   Pulse Readings from Last 3 Encounters:  07/31/19 82  05/20/19 87  05/01/19 80    /10  In general this is a well appearing African American male in no acute distress. He is alert and oriented x4 and appropriate throughout the examination. HEENT reveals that the patient is normocephalic, atraumatic. EOMs are intact. PERRLA. Skin is intact without any evidence of gross lesions. Cardiopulmonary assessment is negative for acute distress and he exhibits normal effort. Abdomen has active bowel sounds in all quadrants and is intact. The abdomen is soft, non tender, non distended. Lower extremities are negative for pretibial pitting edema, deep calf tenderness, cyanosis or clubbing.  KPS = 90  100 - Normal; no complaints; no evidence of disease. 90   - Able to carry on normal activity; minor signs or symptoms of disease. 80   - Normal activity with effort; some signs or symptoms of disease. 58   - Cares for self; unable to carry on normal activity or to do active work. 60   - Requires occasional assistance, but is able to care for most of his personal needs. 50   - Requires considerable assistance and  frequent medical care. 65   - Disabled; requires special care and assistance. 52   - Severely disabled; hospital admission is indicated although death not imminent. 62   - Very sick; hospital admission necessary; active supportive treatment necessary. 10   - Moribund; fatal processes progressing rapidly. 0     - Dead  Karnofsky DA, Abelmann [place], Craver LS and Burchenal [facility] [phone]) The use of the nitrogen mustards in the palliative treatment of carcinoma: with particular reference to bronchogenic carcinoma Cancer 1 634-56   LABORATORY DATA:  Lab Results  Component Value Date   WBC 6.9 05/20/2019   HGB 14.6 05/20/2019   HCT 43.7 05/20/2019   MCV 90 05/20/2019   PLT 193 05/20/2019   Lab Results  Component Value Date   NA 141 05/20/2019   K 5.0 05/20/2019   CL 103 05/20/2019   CO2 28 05/20/2019   Lab Results  Component Value Date   ALT 8 05/20/2019   AST 11 05/20/2019   ALKPHOS 67 05/20/2019   BILITOT 0.5 05/20/2019     RADIOGRAPHY: No results found.    IMPRESSION/PLAN: 65 y.o. gentleman with stage T1c adenocarcinoma of the prostate with a Gleason's score of 3+4 and a PSA of 5.61. We discussed the patient's workup and outlined the nature of prostate cancer in this setting. The patient's T stage, Gleason's score, and PSA put him into the favorable intermediate risk group. Accordingly, he is eligible for a variety of potential treatment options including brachytherapy, 5.5 weeks of external radiation, or prostatectomy.  We discussed the available radiation techniques, and focused on the details and logistics of delivery. We discussed and outlined the risks, benefits, short and long-term effects associated with radiotherapy and compared and contrasted these with prostatectomy. We discussed the role of SpaceOAR in reducing the rectal toxicity associated with radiotherapy.  He and his wife were encouraged to ask questions and were answered to their stated satisfaction.  At the conclusion  of our conversation, the patient is undecided regarding his treatment preference and would like to meet back with Dr. Alinda Money to revisit the possibility of prostatectomy prior to making his final decision.  We will share our discussion with Dr. Jeffie Pollock so that he can help facilitate the follow-up discussion with Dr. Alinda Money and will await the patient's final decision.  He has our contact information and will let us know if he ultimately decides to proceed with a form of radiotherapy.  We enjoyed meeting with him and his wife today and look forward to continuing to participate in his care.   We personally spent 75 minutes in this encounter including chart review, reviewing radiological studies, meeting face-to-face with the patient, entering orders and completing documentation.    Nicholos Johns, PA-C    Tyler Pita, MD  [facility] Direct Dial: [phone]  Fax: [phone] [email or web address]  Skype  [email or web address]

## 2020-11-04 ENCOUNTER — Encounter: Payer: Self-pay | Admitting: Urology

## 2020-11-04 ENCOUNTER — Telehealth: Payer: Self-pay | Admitting: *Deleted

## 2020-11-04 ENCOUNTER — Ambulatory Visit
Admission: RE | Admit: 2020-11-04 | Discharge: 2020-11-04 | Disposition: A | Discharge status: Home / Self Care | Payer: BC Managed Care – PPO | Source: Ambulatory Visit | Attending: Radiation Oncology | Admitting: Radiation Oncology

## 2020-11-04 ENCOUNTER — Ambulatory Visit
Admission: RE | Admit: 2020-11-04 | Discharge: 2020-11-04 | Disposition: A | Discharge status: Home / Self Care | Payer: BC Managed Care – PPO | Source: Ambulatory Visit | Attending: Urology | Admitting: Urology

## 2020-11-04 ENCOUNTER — Other Ambulatory Visit: Payer: Self-pay

## 2020-11-04 VITALS — BP 141/91 | HR 80 | Temp 96.5°F | Resp 18 | Ht 70.0 in | Wt 201.5 lb

## 2020-11-04 DIAGNOSIS — C61 Malignant neoplasm of prostate: Secondary | ICD-10-CM

## 2020-11-04 NOTE — Progress Notes (Signed)
Aua score is 15 SHIM is 21 has consistent back pain that  is 3/10. This is not new has been going on for over 6 months.

## 2020-11-04 NOTE — Telephone Encounter (Signed)
PATIENT WILL BE SEEN BY DR. Alinda Money ON 12-13-20 TO HAVE FURTHER DISCUSSION REGARDING PROSTATECTOMY, PT. TO BE CALLED BY CINDY (NURSE FOR DR. BORDEN)

## 2021-01-26 ENCOUNTER — Telehealth: Payer: Self-pay | Admitting: Urology

## 2021-01-26 ENCOUNTER — Telehealth: Payer: Self-pay

## 2021-01-26 NOTE — Telephone Encounter (Signed)
Mr. Kuehl called to revisit options for treatment of prostate cancer.  Informed Dr. Tammi Klippel and Freeman Caldron, PA.  Follow up visit to be scheduled.  Nothing else follows.

## 2021-01-26 NOTE — Telephone Encounter (Signed)
Dr. Tammi Klippel called and spoke with the patient to confirm his decision to proceed with prostate IMRT. He is in agreement so I will share this discussion with Dr. Jeffie Pollock and Romie Jumper so that we can begin coordinating for fiducial markers and SPaceOAR, first available, in anticipation of beginning his daily treatments in the near future.  Nicholos Johns, MMS, PA-C [place] [facility] [place]: [phone]  Fax: [phone]

## 2021-01-31 ENCOUNTER — Telehealth: Payer: Self-pay | Admitting: *Deleted

## 2021-01-31 NOTE — Telephone Encounter (Signed)
CALLED PATIENT TO INFORM OF FID. MARKERS AND SPACE OAR PLACEMENT ON 02-23-21 @ [facility] AND HIS SIM ON 02-27-21- ARRIVAL TIME- 8:15 AM @ [place], SPOKE WITH PATIENT AND HE IS AWARE OF THESE APPTS.

## 2021-02-21 NOTE — Progress Notes (Signed)
  Radiation Oncology         [phone]) [phone] ________________________________  Name: Melvin Fisher MRN: [id number]  Date: 02/27/2021  DOB: 04/18/1956  SIMULATION AND TREATMENT PLANNING NOTE    ICD-10-CM   1. Malignant neoplasm of prostate ([place])  C61       DIAGNOSIS:  65 y.o. gentleman with stage T1c adenocarcinoma of the prostate with a Gleason's score of 3+4 and a PSA of 5.61  NARRATIVE:  The patient was brought to the [facility].  Identity was confirmed.  All relevant records and images related to the planned course of therapy were reviewed.  The patient freely provided informed written consent to proceed with treatment after reviewing the details related to the planned course of therapy. The consent form was witnessed and verified by the simulation staff.  Then, the patient was set-up in a stable reproducible supine position for radiation therapy.  A vacuum lock pillow device was custom fabricated to position his legs in a reproducible immobilized position.  Then, I performed a urethrogram under sterile conditions to identify the prostatic apex.  CT images were obtained.  Surface markings were placed.  The CT images were loaded into the planning software.  Then the prostate target and avoidance structures including the rectum, bladder, bowel and hips were contoured.  Treatment planning then occurred.  The radiation prescription was entered and confirmed.  A total of one complex treatment devices was fabricated. I have requested : Intensity Modulated Radiotherapy (IMRT) is medically necessary for this case for the following reason:  Rectal sparing.Marland Kitchen  PLAN:  The patient will receive 70 Gy in 28 fractions.  ________________________________  Sheral Apley Tammi Klippel, M.D.

## 2021-02-24 ENCOUNTER — Telehealth: Payer: Self-pay | Admitting: *Deleted

## 2021-02-24 NOTE — Telephone Encounter (Signed)
CALLED PATIENT TO REMIND OF SIM APPT. FOR 02-27-21- ARRIVAL TIME- 8:15 AM @ [facility], SPOKE WITH PATIENT AND HE IS AWARE OF THIS APPT.

## 2021-02-27 ENCOUNTER — Other Ambulatory Visit: Payer: Self-pay

## 2021-02-27 ENCOUNTER — Ambulatory Visit
Admission: RE | Admit: 2021-02-27 | Discharge: 2021-02-27 | Disposition: A | Discharge status: Home / Self Care | Payer: BC Managed Care – PPO | Source: Ambulatory Visit | Attending: Radiation Oncology | Admitting: Radiation Oncology

## 2021-02-27 DIAGNOSIS — Z510 Encounter for antineoplastic radiation therapy: Secondary | ICD-10-CM | POA: Diagnosis not present

## 2021-02-27 DIAGNOSIS — C61 Malignant neoplasm of prostate: Secondary | ICD-10-CM | POA: Insufficient documentation

## 2021-03-08 ENCOUNTER — Ambulatory Visit: Payer: BC Managed Care – PPO

## 2021-03-09 ENCOUNTER — Ambulatory Visit: Payer: BC Managed Care – PPO

## 2021-03-09 DIAGNOSIS — C61 Malignant neoplasm of prostate: Secondary | ICD-10-CM | POA: Diagnosis not present

## 2021-03-10 ENCOUNTER — Ambulatory Visit: Payer: BC Managed Care – PPO

## 2021-03-12 ENCOUNTER — Ambulatory Visit
Admission: RE | Admit: 2021-03-12 | Discharge: 2021-03-12 | Disposition: A | Discharge status: Home / Self Care | Payer: BC Managed Care – PPO | Source: Ambulatory Visit | Attending: Radiation Oncology | Admitting: Radiation Oncology

## 2021-03-12 DIAGNOSIS — C61 Malignant neoplasm of prostate: Secondary | ICD-10-CM | POA: Diagnosis not present

## 2021-03-13 ENCOUNTER — Other Ambulatory Visit: Payer: Self-pay

## 2021-03-13 ENCOUNTER — Ambulatory Visit
Admission: RE | Admit: 2021-03-13 | Discharge: 2021-03-13 | Disposition: A | Discharge status: Home / Self Care | Payer: BC Managed Care – PPO | Source: Ambulatory Visit | Attending: Radiation Oncology | Admitting: Radiation Oncology

## 2021-03-13 DIAGNOSIS — C61 Malignant neoplasm of prostate: Secondary | ICD-10-CM | POA: Diagnosis not present

## 2021-03-14 ENCOUNTER — Ambulatory Visit
Admission: RE | Admit: 2021-03-14 | Discharge: 2021-03-14 | Disposition: A | Discharge status: Home / Self Care | Payer: BC Managed Care – PPO | Source: Ambulatory Visit | Attending: Radiation Oncology | Admitting: Radiation Oncology

## 2021-03-14 DIAGNOSIS — C61 Malignant neoplasm of prostate: Secondary | ICD-10-CM | POA: Diagnosis not present

## 2021-03-15 ENCOUNTER — Other Ambulatory Visit: Payer: Self-pay

## 2021-03-15 ENCOUNTER — Ambulatory Visit
Admission: RE | Admit: 2021-03-15 | Discharge: 2021-03-15 | Disposition: A | Discharge status: Home / Self Care | Payer: BC Managed Care – PPO | Source: Ambulatory Visit | Attending: Radiation Oncology | Admitting: Radiation Oncology

## 2021-03-15 DIAGNOSIS — C61 Malignant neoplasm of prostate: Secondary | ICD-10-CM | POA: Diagnosis not present

## 2021-03-19 ENCOUNTER — Ambulatory Visit: Payer: BC Managed Care – PPO

## 2021-03-20 ENCOUNTER — Ambulatory Visit
Admission: RE | Admit: 2021-03-20 | Discharge: 2021-03-20 | Disposition: A | Discharge status: Home / Self Care | Payer: BC Managed Care – PPO | Source: Ambulatory Visit | Attending: Radiation Oncology | Admitting: Radiation Oncology

## 2021-03-20 ENCOUNTER — Other Ambulatory Visit: Payer: Self-pay

## 2021-03-20 DIAGNOSIS — C61 Malignant neoplasm of prostate: Secondary | ICD-10-CM | POA: Diagnosis not present

## 2021-03-21 ENCOUNTER — Ambulatory Visit
Admission: RE | Admit: 2021-03-21 | Discharge: 2021-03-21 | Disposition: A | Discharge status: Home / Self Care | Payer: BC Managed Care – PPO | Source: Ambulatory Visit | Attending: Radiation Oncology | Admitting: Radiation Oncology

## 2021-03-21 DIAGNOSIS — C61 Malignant neoplasm of prostate: Secondary | ICD-10-CM | POA: Diagnosis not present

## 2021-03-22 ENCOUNTER — Other Ambulatory Visit: Payer: Self-pay

## 2021-03-22 ENCOUNTER — Ambulatory Visit
Admission: RE | Admit: 2021-03-22 | Discharge: 2021-03-22 | Disposition: A | Discharge status: Home / Self Care | Payer: BC Managed Care – PPO | Source: Ambulatory Visit | Attending: Radiation Oncology | Admitting: Radiation Oncology

## 2021-03-22 DIAGNOSIS — C61 Malignant neoplasm of prostate: Secondary | ICD-10-CM | POA: Diagnosis not present

## 2021-03-23 ENCOUNTER — Ambulatory Visit
Admission: RE | Admit: 2021-03-23 | Discharge: 2021-03-23 | Disposition: A | Discharge status: Home / Self Care | Payer: BC Managed Care – PPO | Source: Ambulatory Visit | Attending: Radiation Oncology | Admitting: Radiation Oncology

## 2021-03-23 DIAGNOSIS — C61 Malignant neoplasm of prostate: Secondary | ICD-10-CM | POA: Diagnosis not present

## 2021-03-23 DIAGNOSIS — Z510 Encounter for antineoplastic radiation therapy: Secondary | ICD-10-CM | POA: Insufficient documentation

## 2021-03-24 ENCOUNTER — Ambulatory Visit
Admission: RE | Admit: 2021-03-24 | Discharge: 2021-03-24 | Disposition: A | Discharge status: Home / Self Care | Payer: BC Managed Care – PPO | Source: Ambulatory Visit | Attending: Radiation Oncology | Admitting: Radiation Oncology

## 2021-03-24 ENCOUNTER — Other Ambulatory Visit: Payer: Self-pay

## 2021-03-24 DIAGNOSIS — C61 Malignant neoplasm of prostate: Secondary | ICD-10-CM | POA: Diagnosis not present

## 2021-03-27 ENCOUNTER — Ambulatory Visit
Admission: RE | Admit: 2021-03-27 | Discharge: 2021-03-27 | Disposition: A | Discharge status: Home / Self Care | Payer: BC Managed Care – PPO | Source: Ambulatory Visit | Attending: Radiation Oncology | Admitting: Radiation Oncology

## 2021-03-27 ENCOUNTER — Other Ambulatory Visit: Payer: Self-pay

## 2021-03-27 DIAGNOSIS — C61 Malignant neoplasm of prostate: Secondary | ICD-10-CM | POA: Diagnosis not present

## 2021-03-28 ENCOUNTER — Ambulatory Visit
Admission: RE | Admit: 2021-03-28 | Discharge: 2021-03-28 | Disposition: A | Discharge status: Home / Self Care | Payer: BC Managed Care – PPO | Source: Ambulatory Visit | Attending: Radiation Oncology | Admitting: Radiation Oncology

## 2021-03-28 DIAGNOSIS — C61 Malignant neoplasm of prostate: Secondary | ICD-10-CM | POA: Diagnosis not present

## 2021-03-29 ENCOUNTER — Ambulatory Visit
Admission: RE | Admit: 2021-03-29 | Discharge: 2021-03-29 | Disposition: A | Discharge status: Home / Self Care | Payer: BC Managed Care – PPO | Source: Ambulatory Visit | Attending: Radiation Oncology | Admitting: Radiation Oncology

## 2021-03-29 ENCOUNTER — Other Ambulatory Visit: Payer: Self-pay

## 2021-03-29 DIAGNOSIS — C61 Malignant neoplasm of prostate: Secondary | ICD-10-CM | POA: Diagnosis not present

## 2021-03-30 ENCOUNTER — Other Ambulatory Visit: Payer: Self-pay | Admitting: Radiation Oncology

## 2021-03-30 ENCOUNTER — Ambulatory Visit
Admission: RE | Admit: 2021-03-30 | Discharge: 2021-03-30 | Disposition: A | Discharge status: Home / Self Care | Payer: BC Managed Care – PPO | Source: Ambulatory Visit | Attending: Radiation Oncology | Admitting: Radiation Oncology

## 2021-03-30 ENCOUNTER — Encounter: Payer: Self-pay | Admitting: Radiation Oncology

## 2021-03-30 DIAGNOSIS — C61 Malignant neoplasm of prostate: Secondary | ICD-10-CM | POA: Diagnosis not present

## 2021-03-30 MED ORDER — TAMSULOSIN HCL 0.4 MG PO CAPS: 0.4000 mg | ORAL_CAPSULE | Freq: Every day | ORAL | 5 refills | Status: AC

## 2021-03-31 ENCOUNTER — Ambulatory Visit
Admission: RE | Admit: 2021-03-31 | Discharge: 2021-03-31 | Disposition: A | Discharge status: Home / Self Care | Payer: BC Managed Care – PPO | Source: Ambulatory Visit | Attending: Radiation Oncology | Admitting: Radiation Oncology

## 2021-03-31 ENCOUNTER — Other Ambulatory Visit: Payer: Self-pay

## 2021-03-31 DIAGNOSIS — C61 Malignant neoplasm of prostate: Secondary | ICD-10-CM | POA: Diagnosis not present

## 2021-04-03 ENCOUNTER — Other Ambulatory Visit: Payer: Self-pay

## 2021-04-03 ENCOUNTER — Ambulatory Visit
Admission: RE | Admit: 2021-04-03 | Discharge: 2021-04-03 | Disposition: A | Discharge status: Home / Self Care | Payer: BC Managed Care – PPO | Source: Ambulatory Visit | Attending: Radiation Oncology | Admitting: Radiation Oncology

## 2021-04-03 DIAGNOSIS — C61 Malignant neoplasm of prostate: Secondary | ICD-10-CM | POA: Diagnosis not present

## 2021-04-04 ENCOUNTER — Ambulatory Visit
Admission: RE | Admit: 2021-04-04 | Discharge: 2021-04-04 | Disposition: A | Discharge status: Home / Self Care | Payer: BC Managed Care – PPO | Source: Ambulatory Visit | Attending: Radiation Oncology | Admitting: Radiation Oncology

## 2021-04-04 DIAGNOSIS — C61 Malignant neoplasm of prostate: Secondary | ICD-10-CM | POA: Diagnosis not present

## 2021-04-05 ENCOUNTER — Ambulatory Visit
Admission: RE | Admit: 2021-04-05 | Discharge: 2021-04-05 | Disposition: A | Discharge status: Home / Self Care | Payer: BC Managed Care – PPO | Source: Ambulatory Visit | Attending: Radiation Oncology | Admitting: Radiation Oncology

## 2021-04-05 ENCOUNTER — Other Ambulatory Visit: Payer: Self-pay

## 2021-04-05 DIAGNOSIS — C61 Malignant neoplasm of prostate: Secondary | ICD-10-CM | POA: Diagnosis not present

## 2021-04-06 ENCOUNTER — Ambulatory Visit
Admission: RE | Admit: 2021-04-06 | Discharge: 2021-04-06 | Disposition: A | Discharge status: Home / Self Care | Payer: BC Managed Care – PPO | Source: Ambulatory Visit | Attending: Radiation Oncology | Admitting: Radiation Oncology

## 2021-04-06 DIAGNOSIS — C61 Malignant neoplasm of prostate: Secondary | ICD-10-CM | POA: Diagnosis not present

## 2021-04-07 ENCOUNTER — Ambulatory Visit
Admission: RE | Admit: 2021-04-07 | Discharge: 2021-04-07 | Disposition: A | Discharge status: Home / Self Care | Payer: BC Managed Care – PPO | Source: Ambulatory Visit | Attending: Radiation Oncology | Admitting: Radiation Oncology

## 2021-04-07 ENCOUNTER — Other Ambulatory Visit: Payer: Self-pay

## 2021-04-07 DIAGNOSIS — C61 Malignant neoplasm of prostate: Secondary | ICD-10-CM | POA: Diagnosis not present

## 2021-04-10 ENCOUNTER — Ambulatory Visit
Admission: RE | Admit: 2021-04-10 | Discharge: 2021-04-10 | Disposition: A | Discharge status: Home / Self Care | Payer: BC Managed Care – PPO | Source: Ambulatory Visit | Attending: Radiation Oncology | Admitting: Radiation Oncology

## 2021-04-10 ENCOUNTER — Other Ambulatory Visit: Payer: Self-pay

## 2021-04-10 DIAGNOSIS — C61 Malignant neoplasm of prostate: Secondary | ICD-10-CM | POA: Diagnosis not present

## 2021-04-11 ENCOUNTER — Ambulatory Visit
Admission: RE | Admit: 2021-04-11 | Discharge: 2021-04-11 | Disposition: A | Discharge status: Home / Self Care | Payer: BC Managed Care – PPO | Source: Ambulatory Visit | Attending: Radiation Oncology | Admitting: Radiation Oncology

## 2021-04-11 DIAGNOSIS — C61 Malignant neoplasm of prostate: Secondary | ICD-10-CM | POA: Diagnosis not present

## 2021-04-12 ENCOUNTER — Ambulatory Visit
Admission: RE | Admit: 2021-04-12 | Discharge: 2021-04-12 | Disposition: A | Discharge status: Home / Self Care | Payer: BC Managed Care – PPO | Source: Ambulatory Visit | Attending: Radiation Oncology | Admitting: Radiation Oncology

## 2021-04-12 ENCOUNTER — Other Ambulatory Visit: Payer: Self-pay

## 2021-04-12 DIAGNOSIS — C61 Malignant neoplasm of prostate: Secondary | ICD-10-CM | POA: Diagnosis not present

## 2021-04-13 ENCOUNTER — Ambulatory Visit
Admission: RE | Admit: 2021-04-13 | Discharge: 2021-04-13 | Disposition: A | Discharge status: Home / Self Care | Payer: BC Managed Care – PPO | Source: Ambulatory Visit | Attending: Radiation Oncology | Admitting: Radiation Oncology

## 2021-04-13 DIAGNOSIS — C61 Malignant neoplasm of prostate: Secondary | ICD-10-CM | POA: Diagnosis not present

## 2021-04-14 ENCOUNTER — Other Ambulatory Visit: Payer: Self-pay

## 2021-04-14 ENCOUNTER — Ambulatory Visit
Admission: RE | Admit: 2021-04-14 | Discharge: 2021-04-14 | Disposition: A | Discharge status: Home / Self Care | Payer: BC Managed Care – PPO | Source: Ambulatory Visit | Attending: Radiation Oncology | Admitting: Radiation Oncology

## 2021-04-14 DIAGNOSIS — C61 Malignant neoplasm of prostate: Secondary | ICD-10-CM | POA: Diagnosis not present

## 2021-04-18 ENCOUNTER — Ambulatory Visit
Admission: RE | Admit: 2021-04-18 | Discharge: 2021-04-18 | Disposition: A | Discharge status: Home / Self Care | Payer: BC Managed Care – PPO | Source: Ambulatory Visit | Attending: Radiation Oncology | Admitting: Radiation Oncology

## 2021-04-18 ENCOUNTER — Other Ambulatory Visit: Payer: Self-pay

## 2021-04-18 ENCOUNTER — Ambulatory Visit: Payer: BC Managed Care – PPO

## 2021-04-18 DIAGNOSIS — C61 Malignant neoplasm of prostate: Secondary | ICD-10-CM | POA: Diagnosis not present

## 2021-04-19 ENCOUNTER — Ambulatory Visit: Payer: BC Managed Care – PPO

## 2021-04-19 ENCOUNTER — Ambulatory Visit
Admission: RE | Admit: 2021-04-19 | Discharge: 2021-04-19 | Disposition: A | Discharge status: Home / Self Care | Payer: BC Managed Care – PPO | Source: Ambulatory Visit | Attending: Radiation Oncology | Admitting: Radiation Oncology

## 2021-04-19 DIAGNOSIS — C61 Malignant neoplasm of prostate: Secondary | ICD-10-CM | POA: Diagnosis not present

## 2021-04-20 ENCOUNTER — Other Ambulatory Visit: Payer: Self-pay

## 2021-04-20 ENCOUNTER — Ambulatory Visit
Admission: RE | Admit: 2021-04-20 | Discharge: 2021-04-20 | Disposition: A | Discharge status: Home / Self Care | Payer: BC Managed Care – PPO | Source: Ambulatory Visit | Attending: Radiation Oncology | Admitting: Radiation Oncology

## 2021-04-20 DIAGNOSIS — C61 Malignant neoplasm of prostate: Secondary | ICD-10-CM | POA: Diagnosis not present

## 2021-04-21 ENCOUNTER — Encounter: Payer: Self-pay | Admitting: Urology

## 2021-04-21 ENCOUNTER — Ambulatory Visit
Admission: RE | Admit: 2021-04-21 | Discharge: 2021-04-21 | Disposition: A | Discharge status: Home / Self Care | Payer: BC Managed Care – PPO | Source: Ambulatory Visit | Attending: Radiation Oncology | Admitting: Radiation Oncology

## 2021-04-21 DIAGNOSIS — C61 Malignant neoplasm of prostate: Secondary | ICD-10-CM

## 2021-05-16 ENCOUNTER — Encounter: Payer: Self-pay | Admitting: Urology

## 2021-05-16 NOTE — Progress Notes (Addendum)
Spoke w/ patient, verified identity, and begin nursing interview. Patient states "Doing well. No symptoms to report at this time."  Meaningful use complete. I-PSS score of 10 (moderate) Currently on Flomax 0.4mg  as directed. Urology follow-up- None currently  Patient notified of 8:30am-05/24/21 telephone appointment w/ Ashlyn Bruning PA-C. I left my extension [phone] in case patient needs to call. Patient verbalized understanding of information given.  Patient contact [phone]

## 2021-05-24 ENCOUNTER — Ambulatory Visit
Admission: RE | Admit: 2021-05-24 | Discharge: 2021-05-24 | Disposition: A | Discharge status: Home / Self Care | Payer: BC Managed Care – PPO | Source: Ambulatory Visit | Attending: Urology | Admitting: Urology

## 2021-05-24 DIAGNOSIS — C61 Malignant neoplasm of prostate: Secondary | ICD-10-CM

## 2021-05-24 NOTE — Progress Notes (Signed)
Radiation Oncology         [phone] ________________________________  Name: Melvin Fisher MRN: [id number]  Date: 05/24/2021  DOB: 1956-01-29  Post Treatment Note  CC: [name], MD  Irine Seal, MD  Diagnosis:   66 y.o. gentleman with stage T1c adenocarcinoma of the prostate with a Gleason's score of 3+4 and a PSA of 5.61  Interval Since Last Radiation:  4.5 weeks  03/12/21 - 04/21/21: The prostate was treated to 70 Gy in 28 fractions of 2.5 Gy  Narrative:  I spoke with the patient to conduct his routine scheduled 1 month follow up visit via telephone to spare the patient unnecessary potential exposure in the healthcare setting during the current COVID-19 pandemic.  The patient was notified in advance and gave permission to proceed with this visit format.  He tolerated radiation treatment relatively well with only minor urinary irritation and modest fatigue.  He reported some mild dysuria at the beginning and end of his stream, hesitancy and weak flow of stream which all improved on Flomax daily.  He denied any abdominal pain or bowel issues throughout treatment.                              On review of systems, the patient states that he is doing very well in general.  He reports gradual improvement in the LUTS with a current IPSS score of 10 indicating mild to moderate urinary symptoms only.  He has continued taking Flomax daily as prescribed. He specifically denies dysuria, gross hematuria or incontinence.  He continues with increased frequency, urgency and occasionally will have a weaker flow of stream and hesitancy depending on how long he postpones voiding.  Otherwise, the majority of the time, he feels like he has a good flow of stream and empties his bladder well on voiding.  He reports a healthy appetite and is maintaining his weight.  He has had some mild constipation since completing treatment but specifically denies abdominal pain, nausea, vomiting or diarrhea.  His  energy level is gradually improving and he has been able to remain active.  Overall, he is quite pleased with his progress to date.  ALLERGIES:  has No Known Allergies.  Meds: Current Outpatient Medications  Medication Sig Dispense Refill   aspirin 81 MG chewable tablet Chew 81 mg by mouth daily.     clobetasol cream (TEMOVATE) 0.05 % Apply topically 2 (two) times daily as needed.     doxepin (SINEQUAN) 25 MG capsule Take 25 mg by mouth at bedtime.     finasteride (PROSCAR) 5 MG tablet Take 5 mg by mouth daily.     lisinopril (PRINIVIL,ZESTRIL) 20 MG tablet Take 10 mg by mouth daily.      mirtazapine (REMERON) 30 MG tablet Take 30 mg by mouth at bedtime.     pravastatin (PRAVACHOL) 20 MG tablet Take 20 mg by mouth every other day.     tamsulosin (FLOMAX) 0.4 MG CAPS capsule Take 1 capsule (0.4 mg total) by mouth daily after supper. 30 capsule 5   No current facility-administered medications for this encounter.    Physical Findings:  vitals were not taken for this visit.  Pain Assessment Pain Score: 0-No pain/10 Unable to assess due to telephone follow-up visit format.   Lab Findings: Lab Results  Component Value Date   WBC 6.9 05/20/2019   HGB 14.6 05/20/2019   HCT 43.7 05/20/2019   MCV 90  05/20/2019   PLT 193 05/20/2019     Radiographic Findings: No results found.  Impression/Plan: 1. 66 y.o. gentleman with stage T1c adenocarcinoma of the prostate with a Gleason's score of 3+4 and a PSA of 5.61.  He will continue to follow up with urology for ongoing PSA determinations but does not currently have a follow-up appointment scheduled with Dr. Jeffie Pollock to his knowledge. He will continue taking the Flomax daily as prescribed and understands what to expect with regards to PSA monitoring going forward. I will look forward to following his response to treatment via correspondence with urology, and would be happy to continue to participate in his care if clinically indicated. I talked to  the patient about what to expect in the future, including his risk for erectile dysfunction and rectal bleeding. I encouraged him to call or return to the office if he has any questions regarding his previous radiation or possible radiation side effects. He was comfortable with this plan and will follow up as needed.     Nicholos Johns, PA-C

## 2021-05-24 NOTE — Progress Notes (Signed)
°  Radiation Oncology         [phone]) [phone] ________________________________  Name: Melvin Fisher MRN: [id number]  Date: 04/21/2021  DOB: 07-26-1955  End of Treatment Note  Diagnosis:    66 y.o. gentleman with stage T1c adenocarcinoma of the prostate with a Gleason's score of 3+4 and a PSA of 5.61     Indication for treatment:  Curative, Definitive Radiotherapy       Radiation treatment dates:   03/12/21 - 04/21/21  Site/dose:   The prostate was treated to 70 Gy in 28 fractions of 2.5 Gy  Beams/energy:   The patient was treated with IMRT using volumetric arc therapy delivering 6 MV X-rays to clockwise and counterclockwise circumferential arcs with a 90 degree collimator offset to avoid dose scalloping.  Image guidance was performed with daily cone beam CT prior to each fraction to align to gold markers in the prostate and assure proper bladder and rectal fill volumes.  Immobilization was achieved with BodyFix custom mold.  Narrative: The patient tolerated radiation treatment relatively well with only minor urinary irritation and modest fatigue.  He reported some mild dysuria at the beginning and end of his stream, hesitancy and weak flow of stream which all improved on Flomax daily.  He denied any abdominal pain or bowel issues throughout treatment.  Plan: The patient has completed radiation treatment. He will return to radiation oncology clinic for routine followup in one month. I advised him to call or return sooner if he has any questions or concerns related to his recovery or treatment. ________________________________  Sheral Apley. Tammi Klippel, M.D.

## 2021-05-31 ENCOUNTER — Ambulatory Visit: Payer: Self-pay | Admitting: Urology

## 2021-07-17 DIAGNOSIS — N401 Enlarged prostate with lower urinary tract symptoms: Secondary | ICD-10-CM | POA: Diagnosis not present

## 2021-07-17 DIAGNOSIS — C61 Malignant neoplasm of prostate: Secondary | ICD-10-CM | POA: Diagnosis not present

## 2021-07-17 DIAGNOSIS — R351 Nocturia: Secondary | ICD-10-CM | POA: Diagnosis not present

## 2021-07-17 DIAGNOSIS — R350 Frequency of micturition: Secondary | ICD-10-CM | POA: Diagnosis not present

## 2021-11-01 DIAGNOSIS — L299 Pruritus, unspecified: Secondary | ICD-10-CM | POA: Diagnosis not present

## 2021-11-01 DIAGNOSIS — I10 Essential (primary) hypertension: Secondary | ICD-10-CM | POA: Diagnosis not present

## 2021-11-01 DIAGNOSIS — H1132 Conjunctival hemorrhage, left eye: Secondary | ICD-10-CM | POA: Diagnosis not present

## 2021-12-04 DIAGNOSIS — H524 Presbyopia: Secondary | ICD-10-CM | POA: Diagnosis not present

## 2021-12-04 DIAGNOSIS — H04123 Dry eye syndrome of bilateral lacrimal glands: Secondary | ICD-10-CM | POA: Diagnosis not present

## 2021-12-04 DIAGNOSIS — H43812 Vitreous degeneration, left eye: Secondary | ICD-10-CM | POA: Diagnosis not present

## 2022-02-03 DIAGNOSIS — N390 Urinary tract infection, site not specified: Secondary | ICD-10-CM | POA: Diagnosis not present

## 2022-02-09 DIAGNOSIS — C61 Malignant neoplasm of prostate: Secondary | ICD-10-CM | POA: Diagnosis not present

## 2022-02-16 DIAGNOSIS — R300 Dysuria: Secondary | ICD-10-CM | POA: Diagnosis not present

## 2022-02-16 DIAGNOSIS — R351 Nocturia: Secondary | ICD-10-CM | POA: Diagnosis not present

## 2022-02-16 DIAGNOSIS — C61 Malignant neoplasm of prostate: Secondary | ICD-10-CM | POA: Diagnosis not present

## 2022-02-16 DIAGNOSIS — R350 Frequency of micturition: Secondary | ICD-10-CM | POA: Diagnosis not present

## 2022-02-22 ENCOUNTER — Emergency Department (HOSPITAL_COMMUNITY)
Admission: EM | Admit: 2022-02-22 | Discharge: 2022-02-22 | Disposition: A | Discharge status: Home / Self Care | Payer: BC Managed Care – PPO | Attending: Emergency Medicine | Admitting: Emergency Medicine

## 2022-02-22 ENCOUNTER — Other Ambulatory Visit: Payer: Self-pay

## 2022-02-22 ENCOUNTER — Encounter (HOSPITAL_COMMUNITY): Payer: Self-pay

## 2022-02-22 ENCOUNTER — Emergency Department (HOSPITAL_COMMUNITY): Payer: BC Managed Care – PPO

## 2022-02-22 DIAGNOSIS — R7989 Other specified abnormal findings of blood chemistry: Secondary | ICD-10-CM | POA: Diagnosis not present

## 2022-02-22 DIAGNOSIS — R000 Tachycardia, unspecified: Secondary | ICD-10-CM | POA: Diagnosis not present

## 2022-02-22 DIAGNOSIS — Z7982 Long term (current) use of aspirin: Secondary | ICD-10-CM | POA: Insufficient documentation

## 2022-02-22 DIAGNOSIS — I471 Supraventricular tachycardia, unspecified: Secondary | ICD-10-CM | POA: Diagnosis not present

## 2022-02-22 LAB — BASIC METABOLIC PANEL
Anion gap: 6 (ref 5–15)
BUN: 25 mg/dL — ABNORMAL HIGH (ref 8–23)
CO2: 24 mmol/L (ref 22–32)
Calcium: 9 mg/dL (ref 8.9–10.3)
Chloride: 110 mmol/L (ref 98–111)
Creatinine, Ser: 1.47 mg/dL — ABNORMAL HIGH (ref 0.61–1.24)
GFR, Estimated: 52 mL/min — ABNORMAL LOW (ref >60)
Glucose, Bld: 121 mg/dL — ABNORMAL HIGH (ref 70–99)
Potassium: 4.7 mmol/L (ref 3.5–5.1)
Sodium: 140 mmol/L (ref 135–145)

## 2022-02-22 LAB — CBC WITH DIFFERENTIAL/PLATELET
Abs Immature Granulocytes: 0.09 10*3/uL — ABNORMAL HIGH (ref 0.00–0.07)
Basophils Absolute: 0 10*3/uL (ref 0.0–0.1)
Basophils Relative: 0 %
Eosinophils Absolute: 0.2 10*3/uL (ref 0.0–0.5)
Eosinophils Relative: 2 %
HCT: 41.8 % (ref 39.0–52.0)
Hemoglobin: 13.8 g/dL (ref 13.0–17.0)
Immature Granulocytes: 1 %
Lymphocytes Relative: 11 %
Lymphs Abs: 1 10*3/uL (ref 0.7–4.0)
MCH: 29.6 pg (ref 26.0–34.0)
MCHC: 33 g/dL (ref 30.0–36.0)
MCV: 89.7 fL (ref 80.0–100.0)
Monocytes Absolute: 0.8 10*3/uL (ref 0.1–1.0)
Monocytes Relative: 9 %
Neutro Abs: 6.9 10*3/uL (ref 1.7–7.7)
Neutrophils Relative %: 77 %
Platelets: 175 10*3/uL (ref 150–400)
RBC: 4.66 MIL/uL (ref 4.22–5.81)
RDW: 12.7 % (ref 11.5–15.5)
WBC: 9 10*3/uL (ref 4.0–10.5)
nRBC: 0 % (ref 0.0–0.2)

## 2022-02-22 LAB — MAGNESIUM: Magnesium: 2 mg/dL (ref 1.7–2.4)

## 2022-02-22 MED ORDER — LACTATED RINGERS IV BOLUS
1000.0000 mL | Freq: Once | INTRAVENOUS | Status: AC
  Administered 2022-02-22: 1000 mL via INTRAVENOUS

## 2022-02-22 MED ORDER — ADENOSINE 6 MG/2ML IV SOLN
6.0000 mg | Freq: Once | INTRAVENOUS | Status: DC
  Filled 2022-02-22: qty 2

## 2022-02-22 NOTE — ED Provider Notes (Signed)
[facility] DEPT Provider Note   CSN: [id number] Arrival date & time: 02/22/22  1337     History  Chief Complaint  Patient presents with   Tachycardia    Melvin Fisher is a 66 y.o. male.  HPI 66 year old male with a history of prior palpitations with no clear diagnosis presents with recurrent palpitations and lightheadedness.  Every couple months or so he will get episodes of palpitations and then he can bend over and touch his toes and get it to go away.  This happened last night.  However it happened again this morning and despite doing his procedure making it go away, it came right back.  He has not been ill recently.  He denies any significant caffeine use as he cut out caffeine when he told his doctor about it.  He drinks alcohol occasionally but not often.  No chest pain, shortness of breath.  Home Medications Prior to Admission medications   Medication Sig Start Date End Date Taking? Authorizing Provider  aspirin 81 MG chewable tablet Chew 81 mg by mouth daily.   Yes [provider]  Carboxymethylcellulose Sodium (EYE DROPS OP) Place 1 drop into both eyes 2 (two) times daily as needed (dry eye).   Yes [provider]  clobetasol cream (TEMOVATE) 7.09 % Apply 1 Application topically daily as needed (rash). 10/07/20  Yes [provider]  finasteride (PROSCAR) 5 MG tablet Take 5 mg by mouth daily. 10/12/20  Yes [provider]  ibuprofen (ADVIL) 200 MG tablet Take 400 mg by mouth daily as needed for mild pain.   Yes [provider]  lisinopril (ZESTRIL) 10 MG tablet Take 10 mg by mouth daily.   Yes [provider]  meloxicam (MOBIC) 15 MG tablet Take 15 mg by mouth daily as needed for pain.   Yes [provider]  pravastatin (PRAVACHOL) 20 MG tablet Take 20 mg by mouth every other day.   Yes [provider]  tamsulosin (FLOMAX) 0.4 MG CAPS capsule Take 1 capsule (0.4 mg total)  by mouth daily after supper. Patient taking differently: Take 0.4 mg by mouth daily. 03/30/21  Yes Tyler Pita, MD  lisinopril (PRINIVIL,ZESTRIL) 20 MG tablet Take 10 mg by mouth daily.  Patient not taking: Reported on 02/22/2022    [provider]  sulfamethoxazole-trimethoprim (BACTRIM DS) 800-160 MG tablet SMARTSIG:1 Tablet(s) By Mouth Morning-Evening Patient not taking: Reported on 02/22/2022 02/03/22   [provider]      Allergies    Patient has no known allergies.    Review of Systems   Review of Systems  Respiratory:  Negative for shortness of breath.   Cardiovascular:  Positive for palpitations. Negative for chest pain.  Neurological:  Positive for light-headedness.    Physical Exam Updated Vital Signs BP 110/80   Pulse 80   Temp 98 F (36.7 C) (Oral)   Resp 17   SpO2 100%  Physical Exam Vitals and nursing note reviewed.  Constitutional:      General: He is not in acute distress.    Appearance: He is well-developed. He is not ill-appearing or diaphoretic.  HENT:     Head: Normocephalic and atraumatic.  Cardiovascular:     Rate and Rhythm: Regular rhythm. Tachycardia present.     Heart sounds: Normal heart sounds.  Pulmonary:     Effort: Pulmonary effort is normal.     Breath sounds: Normal breath sounds.  Abdominal:     General: There is  no distension.     Palpations: Abdomen is soft.     Tenderness: There is no abdominal tenderness.  Skin:    General: Skin is warm and dry.  Neurological:     Mental Status: He is alert.     ED Results / Procedures / Treatments   Labs (all labs ordered are listed, but only abnormal results are displayed) Labs Reviewed  BASIC METABOLIC PANEL - Abnormal; Notable for the following components:      Result Value   Glucose, Bld 121 (*)    BUN 25 (*)    Creatinine, Ser 1.47 (*)    GFR, Estimated 52 (*)    All other components within normal limits  CBC WITH DIFFERENTIAL/PLATELET - Abnormal; Notable for  the following components:   Abs Immature Granulocytes 0.09 (*)    All other components within normal limits  MAGNESIUM  TSH    EKG EKG Interpretation  Date/Time:  Thursday February 22 2022 13:56:03 EDT Ventricular Rate:  177 PR Interval:  91 QRS Duration: 115 QT Interval:  260 QTC Calculation: 447 R Axis:   -8 Text Interpretation: Supraventricular tachycardia Right bundle branch block ST depression, probably rate related Confirmed by Lajean Saver [id number] on 02/22/2022 1:57:35 PM   EKG Interpretation  Date/Time:  Thursday February 22 2022 14:09:56 EDT Ventricular Rate:  93 PR Interval:  171 QRS Duration: 80 QT Interval:  342 QTC Calculation: 426 R Axis:   -6 Text Interpretation: Sinus rhythm SVT resolved. Confirmed by Sherwood Gambler [phone]) on 02/22/2022 4:19:09 PM        Radiology DG Chest Portable 1 View  Result Date: 02/22/2022 CLINICAL DATA:  SVT EXAM: PORTABLE CHEST 1 VIEW COMPARISON:  09/11/16 CXR FINDINGS: No pleural effusion. No pneumothorax. Normal cardiac and mediastinal contours. No focal airspace opacity. Normal cardiac and mediastinal contours.No displaced rib fractures. Visualized upper abdomen is unremarkable. IMPRESSION: No focal airspace opacity. Electronically Signed   By: Marin Roberts M.D.   On: 02/22/2022 14:40    Procedures Procedures    Medications Ordered in ED Medications  adenosine (ADENOCARD) 6 MG/2ML injection 6 mg (0 mg Intravenous Hold 02/22/22 1410)  lactated ringers bolus 1,000 mL (0 mLs Intravenous Stopped 02/22/22 1543)    ED Course/ Medical Decision Making/ A&P                           Medical Decision Making Amount and/or Complexity of Data Reviewed Labs: ordered.    Details: Normal him, potassium.  Mildly bumped creatinine from baseline but last check was 2 years ago Radiology: ordered.    Details: No CHF ECG/medicine tests: independent interpretation performed.    Details: SVT.  After this resolved his ECG is  unremarkable  Risk Prescription drug management.   Patient presents with symptomatic SVT.  He was hypotensive initially though he was able to convert himself after a failed REVERT maneuver by bending over to touch his toes.  This is how it typically resolves at home.  However today it came back which is why he presented given continued dizziness as well.  He has been having on and off symptoms that are similar to this and likely has had SVT for a while.  We will refer him to cardiology but currently his blood pressures are low normal and so I am not sure that starting an AV nodal blocker is ideal at this time and will defer this decision to cardiology.  Otherwise, labs are overall  unremarkable and he appears stable for discharge home.  Given return precautions.        Final Clinical Impression(s) / ED Diagnoses Final diagnoses:  SVT (supraventricular tachycardia)    Rx / DC Orders ED Discharge Orders          Ordered    Ambulatory referral to Cardiology       Comments: If you have not heard from the Cardiology office within the next 72 hours please call [phone].   02/22/22 1531              Sherwood Gambler, MD 02/22/22 1621

## 2022-02-22 NOTE — Discharge Instructions (Addendum)
If you develop recurrent palpitations that will not go away, dizziness, lightheadedness, chest pain, shortness of breath, or any other new/concerning symptoms and return to the ER or call 911.

## 2022-02-22 NOTE — ED Triage Notes (Signed)
Pt c/o light headedness and palpitations. Pt denies chest pain, but feels heart is pounding. Pt HR 184 in triage, moved to room 15. Dr. Regenia Skeeter in room.

## 2022-02-23 ENCOUNTER — Ambulatory Visit: Payer: BC Managed Care – PPO | Attending: Internal Medicine | Admitting: Internal Medicine

## 2022-02-23 ENCOUNTER — Ambulatory Visit (INDEPENDENT_AMBULATORY_CARE_PROVIDER_SITE_OTHER): Payer: BC Managed Care – PPO

## 2022-02-23 ENCOUNTER — Encounter: Payer: Self-pay | Admitting: Internal Medicine

## 2022-02-23 VITALS — BP 132/68 | HR 74 | Ht 70.0 in | Wt 196.8 lb

## 2022-02-23 DIAGNOSIS — I471 Supraventricular tachycardia, unspecified: Secondary | ICD-10-CM | POA: Diagnosis not present

## 2022-02-23 MED ORDER — METOPROLOL SUCCINATE ER 25 MG PO TB24: 25.0000 mg | ORAL_TABLET | Freq: Every day | ORAL | 1 refills | Status: DC

## 2022-02-23 NOTE — Progress Notes (Unsigned)
Enrolled patient for a 7 day Zio XT monitor to be mailed to patients home.  

## 2022-02-23 NOTE — Patient Instructions (Signed)
Medication Instructions:  START: METOPROLOL SUCCINATE '25mg'$  ONCE DAILY  *If you need a refill on your cardiac medications before your next appointment, please call your pharmacy*  Lab Work: None Ordered At This Time.  If you have labs (blood work) drawn today and your tests are completely normal, you will receive your results only by: [place] (if you have MyChart) OR A paper copy in the mail If you have any lab test that is abnormal or we need to change your treatment, we will call you to review the results.  Testing/Procedures:  Bryn Gulling- Long Term Monitor Instructions   Your physician has requested you wear your ZIO patch monitor___7____days.   This is a single patch monitor.  [employer] supplies one patch monitor per enrollment.  Additional stickers are not available.   Please do not apply patch if you will be having a Nuclear Stress Test, Echocardiogram, Cardiac CT, MRI, or Chest Xray during the time frame you would be wearing the monitor. The patch cannot be worn during these tests.  You cannot remove and re-apply the ZIO XT patch monitor.   Your ZIO patch monitor will be sent USPS Priority mail from [place] directly to your home address. The monitor may also be mailed to a PO BOX if home delivery is not available.   It may take 3-5 days to receive your monitor after you have been enrolled.   Once you have received you monitor, please review enclosed instructions.  Your monitor has already been registered assigning a specific monitor serial # to you.   Applying the monitor   Shave hair from upper left chest.   Hold abrader disc by orange tab.  Rub abrader in 40 strokes over left upper chest as indicated in your monitor instructions.   Clean area with 4 enclosed alcohol pads .  Use all pads to assure are is cleaned thoroughly.  Let dry.   Apply patch as indicated in monitor instructions.  Patch will be place under collarbone on left side of chest with arrow  pointing upward.   Rub patch adhesive wings for 2 minutes.Remove white label marked "1".  Remove white label marked "2".  Rub patch adhesive wings for 2 additional minutes.   While looking in a mirror, press and release button in center of patch.  A small green light will flash 3-4 times .  This will be your only indicator the monitor has been turned on.     Do not shower for the first 24 hours.  You may shower after the first 24 hours.   Press button if you feel a symptom. You will hear a small click.  Record Date, Time and Symptom in the Patient Log Book.   When you are ready to remove patch, follow instructions on last 2 pages of Patient Log Book.  Stick patch monitor onto last page of Patient Log Book.   Place Patient Log Book in [place] box.  Use locking tab on box and tape box closed securely.  The [employer] and [employer] has [employer] on it.  Please place in mailbox as soon as possible.  Your physician should have your test results approximately 7 days after the monitor has been mailed back to [facility].   Call [place] at [phone] if you have questions regarding your ZIO XT patch monitor.  Call them immediately if you see an orange light blinking on your monitor.   If your monitor falls off in less than  4 days contact our Monitor department at [phone].  If your monitor becomes loose or falls off after 4 days call [employer] at [phone] for suggestions on securing your monitor.   Follow-Up: At [facility], you and your health needs are our priority.  As part of our continuing mission to provide you with exceptional heart care, we have created designated Provider Care Teams.  These Care Teams include your primary Cardiologist (physician) and Advanced Practice Providers (APPs -  Physician Assistants and Nurse Practitioners) who all work together to provide you with the care you need, when you need it.  Your next appointment:   6  month(s)  The format for your next appointment:   In Person  Provider:   Janina Mayo, MD    REFERRAL TO ELECTROPHYSIOLOGY- SOMEONE [place]

## 2022-02-23 NOTE — Progress Notes (Signed)
Cardiology Office Note:    Date:  02/23/2022   ID:  Melvin Fisher, DOB 1956-01-17, MRN [id number]  PCP:  Melvin Fisher, [facility] Cardiologist:  Melvin Mayo, MD     Referring MD: Melvin Gambler, MD   No chief complaint on file. Palpitations  History of Present Illness:    Melvin Fisher is a 66 y.o. male with a hx of anxiety, Hep C,  referral for palpitations, he felt fatigued. He had an ECG in the ED yesterday with HR 177. He felt LH. He states this has been going on since he was a child, but has recently progressed.  No syncope. No family hx of cardiac dx. No smoking. He drives a trailer.  He saw Dr. Gwenlyn Fisher in 2018. He was seen for SOB and atypical epigastric pain. His echo was unremarkable and his ETT did not show ischemic changes.   Past Medical History:  Diagnosis Date   Anxiety    BPH (benign prostatic hyperplasia)    Diverticulosis    Eczema    Hemangioma of liver    Hepatitis C    Hypertension    Palpitations    Prostate cancer ([place])     Past Surgical History:  Procedure Laterality Date   CARPAL TUNNEL RELEASE     PROSTATE BIOPSY     ROTATOR CUFF REPAIR      Current Medications: Current Meds  Medication Sig   aspirin 81 MG chewable tablet Chew 81 mg by mouth daily.   Carboxymethylcellulose Sodium (EYE DROPS OP) Place 1 drop into both eyes 2 (two) times daily as needed (dry eye).   clobetasol cream (TEMOVATE) 9.50 % Apply 1 Application topically daily as needed (rash).   finasteride (PROSCAR) 5 MG tablet Take 5 mg by mouth daily.   ibuprofen (ADVIL) 200 MG tablet Take 400 mg by mouth daily as needed for mild pain.   lisinopril (PRINIVIL,ZESTRIL) 20 MG tablet Take 10 mg by mouth daily.   lisinopril (ZESTRIL) 10 MG tablet Take 10 mg by mouth daily.   meloxicam (MOBIC) 15 MG tablet Take 15 mg by mouth daily as needed for pain.   metoprolol succinate (TOPROL-XL) 25 MG 24 hr tablet Take 1 tablet (25 mg total) by mouth  daily. Take with or immediately following a meal.   pravastatin (PRAVACHOL) 20 MG tablet Take 20 mg by mouth every other day.   sulfamethoxazole-trimethoprim (BACTRIM DS) 800-160 MG tablet    tamsulosin (FLOMAX) 0.4 MG CAPS capsule Take 1 capsule (0.4 mg total) by mouth daily after supper. (Patient taking differently: Take 0.4 mg by mouth daily.)     Allergies:   Patient has no known allergies.   Social History   Socioeconomic History   Marital status: Married    Spouse name: Not on file   Number of children: 2   Years of education: Not on file   Highest education level: High school graduate  Occupational History   Not on file  Tobacco Use   Smoking status: Never   Smokeless tobacco: Never  Vaping Use   Vaping Use: Never used  Substance and Sexual Activity   Alcohol use: Yes    Comment: occasionally   Drug use: No   Sexual activity: Yes  Other Topics Concern   Not on file  Social History Narrative   Lives at home with wife & daughter   Left handed   Caffeine: none   Married to Melvin Fisher. Patient's son,  Melvin Fisher, resides in [place]. Patient's daughter, Melvin Fisher, lives with him.   Social Determinants of Health   Financial Resource Strain: Not on file  Food Insecurity: Not on file  Transportation Needs: Not on file  Physical Activity: Not on file  Stress: Not on file  Social Connections: Not on file     Family History: The patient's family history includes Breast cancer in his maternal grandmother; Cancer - Lung in his father and sister; Diabetes Mellitus II in his maternal grandmother; Hypertension in his mother, sister, and sister; Neuropathy in his maternal uncle; Prostate cancer in his maternal uncle. There is no history of Colon cancer or Pancreatic cancer.  ROS:   Please see the history of present illness.     All other systems reviewed and are negative.  EKGs/Labs/Other Studies Reviewed:    The following studies were reviewed today:   EKG:  EKG is   ordered today.  The ekg ordered today demonstrates   02/23/2022- NSR  Recent Labs: 02/22/2022: BUN 25; Creatinine, Ser 1.47; Hemoglobin 13.8; Magnesium 2.0; Platelets 175; Potassium 4.7; Sodium 140   Recent Lipid Panel No results Fisher for: "CHOL", "TRIG", "HDL", "CHOLHDL", "VLDL", "LDLCALC", "LDLDIRECT"   Risk Assessment/Calculations:     Physical Exam:    VS:  BP 132/68   Pulse 74   Ht '5\' 10"'$  (1.778 m)   Wt 196 lb 12.8 oz (89.3 kg)   SpO2 99%   BMI 28.24 kg/m     Wt Readings from Last 3 Encounters:  02/23/22 196 lb 12.8 oz (89.3 kg)  11/04/20 201 lb 8 oz (91.4 kg)  07/31/19 197 lb (89.4 kg)     GEN:  Well nourished, well developed in no acute distress HEENT: Normal NECK: No JVD; No carotid bruits LYMPHATICS: No lymphadenopathy CARDIAC: RRR, no murmurs, rubs, gallops RESPIRATORY:  Clear to auscultation without rales, wheezing or rhonchi  ABDOMEN: Soft, non-tender, non-distended MUSCULOSKELETAL:  No edema; No deformity  SKIN: Warm and dry NEUROLOGIC:  Alert and oriented x 3 PSYCHIATRIC:  Normal affect   ASSESSMENT:    SVT: short RP tachycardia.  Will start BB with recurrent symptoms. He knows how to do vagal maneuvers. Will plan for a ziopatch and EP referral to consider EP study/SVT ablation.  PLAN:    In order of problems listed above:  Metoprolol 25 mg XL 7 day ziopatch EP referral to consider SVT ablation           Medication Adjustments/Labs and Tests Ordered: Current medicines are reviewed at length with the patient today.  Concerns regarding medicines are outlined above.  Orders Placed This Encounter  Procedures   Ambulatory referral to Cardiac Electrophysiology   LONG TERM MONITOR (3-14 DAYS)   Meds ordered this encounter  Medications   metoprolol succinate (TOPROL-XL) 25 MG 24 hr tablet    Sig: Take 1 tablet (25 mg total) by mouth daily. Take with or immediately following a meal.    Dispense:  90 tablet    Refill:  1    Patient  Instructions  Medication Instructions:  START: METOPROLOL SUCCINATE '25mg'$  ONCE DAILY  *If you need a refill on your cardiac medications before your next appointment, please call your pharmacy*  Lab Work: None Ordered At This Time.  If you have labs (blood work) drawn today and your tests are completely normal, you will receive your results only by: [place] (if you have MyChart) OR A paper copy in the mail If you have any lab test that is  abnormal or we need to change your treatment, we will call you to review the results.  Testing/Procedures:  Bryn Gulling- Long Term Monitor Instructions   Your physician has requested you wear your ZIO patch monitor___7____days.   This is a single patch monitor.  [employer] supplies one patch monitor per enrollment.  Additional stickers are not available.   Please do not apply patch if you will be having a Nuclear Stress Test, Echocardiogram, Cardiac CT, MRI, or Chest Xray during the time frame you would be wearing the monitor. The patch cannot be worn during these tests.  You cannot remove and re-apply the ZIO XT patch monitor.   Your ZIO patch monitor will be sent USPS Priority mail from [facility] directly to your home address. The monitor may also be mailed to a PO BOX if home delivery is not available.   It may take 3-5 days to receive your monitor after you have been enrolled.   Once you have received you monitor, please review enclosed instructions.  Your monitor has already been registered assigning a specific monitor serial # to you.   Applying the monitor   Shave hair from upper left chest.   Hold abrader disc by orange tab.  Rub abrader in 40 strokes over left upper chest as indicated in your monitor instructions.   Clean area with 4 enclosed alcohol pads .  Use all pads to assure are is cleaned thoroughly.  Let dry.   Apply patch as indicated in monitor instructions.  Patch will be place under collarbone on left side of chest  with arrow pointing upward.   Rub patch adhesive wings for 2 minutes.Remove white label marked "1".  Remove white label marked "2".  Rub patch adhesive wings for 2 additional minutes.   While looking in a mirror, press and release button in center of patch.  A small green light will flash 3-4 times .  This will be your only indicator the monitor has been turned on.     Do not shower for the first 24 hours.  You may shower after the first 24 hours.   Press button if you feel a symptom. You will hear a small click.  Record Date, Time and Symptom in the Patient Log Book.   When you are ready to remove patch, follow instructions on last 2 pages of Patient Log Book.  Stick patch monitor onto last page of Patient Log Book.   Place Patient Log Book in [place] box.  Use locking tab on box and tape box closed securely.  The [employer] has [employer] on it.  Please place in mailbox as soon as possible.  Your physician should have your test results approximately 7 days after the monitor has been mailed back to [facility].   Call [place] at [phone] if you have questions regarding your ZIO XT patch monitor.  Call them immediately if you see an orange light blinking on your monitor.   If your monitor falls off in less than 4 days contact our Monitor department at [phone].  If your monitor becomes loose or falls off after 4 days call [employer] at [phone] for suggestions on securing your monitor.   Follow-Up: At [facility], you and your health needs are our priority.  As part of our continuing mission to provide you with exceptional heart care, we have created designated Provider Care Teams.  These Care Teams include your primary Cardiologist (physician) and Advanced Practice  Providers (APPs -  Physician Assistants and Nurse Practitioners) who all work together to provide you with the care you need, when you need it.  Your next appointment:   6  month(s)  The format for your next appointment:   In Person  Provider:   Janina Mayo, MD    REFERRAL TO [place]      Signed, Melvin Mayo, MD  02/23/2022 9:20 AM    [place]

## 2022-02-23 NOTE — Addendum Note (Signed)
Addended by: Rexanne Mano B on: 02/23/2022 10:31 AM   Modules accepted: Orders

## 2022-02-26 DIAGNOSIS — I471 Supraventricular tachycardia, unspecified: Secondary | ICD-10-CM

## 2022-03-12 ENCOUNTER — Telehealth: Payer: Self-pay | Admitting: Internal Medicine

## 2022-03-12 MED ORDER — DILTIAZEM HCL 30 MG PO TABS: ORAL_TABLET | ORAL | 3 refills | Status: AC

## 2022-03-12 NOTE — Telephone Encounter (Addendum)
Spoke to patient Dr.Branch advised to stop Metoprolol.Start Diltiazem 30 mg every 6 hours as needed for palpitations.He will keep appointment already scheduled with Dr.Greg Lovena Le 12/19 at 9:00 am.

## 2022-03-12 NOTE — Telephone Encounter (Signed)
Pt c/o medication issue:  1. Name of Medication: metoprolol succinate (TOPROL-XL) 25 MG 24 hr tablet   2. How are you currently taking this medication (dosage and times per day)? Take 1 tablet (25 mg total) by mouth daily. Take with or immediately following a meal. - Oral   3. Are you having a reaction (difficulty breathing--STAT)?   4. What is your medication issue? Pt states this medication is not working because he still have the same symptoms with his heart racing.

## 2022-03-12 NOTE — Telephone Encounter (Signed)
Spoke to patient he stated he continues to have palpitations.He has been taking Metoprolol Succ 25 mg daily for the past 3 weeks.Stated it has helped alittle Advised I will send message to Dr.Branch for advice.

## 2022-03-20 ENCOUNTER — Telehealth: Payer: Self-pay | Admitting: Internal Medicine

## 2022-03-20 NOTE — Telephone Encounter (Signed)
[employer] is calling to report abnormal heart monitor results.Marland Kitchen

## 2022-03-20 NOTE — Telephone Encounter (Signed)
   Cardiac Monitor Alert  Date of alert:  03/20/2022   Patient Name: Melvin Fisher  DOB: January 26, 1956  MRN: [id number]   [place] Cardiologist: Janina Mayo, MD  [facility] EP:  None    Monitor Information: Long Term Monitor [ZioXT]  Reason:  SVT Ordering provider:  Phineas Inches, MD   Alert Supraventricular Tachycardia - fastest HR:  197bpm This is the 1st alert for this rhythm.   Next Cardiology Appointment   Date:  12/19 @ 9am Provider:  Cristopher Peru, MD  The patient was contacted today.  He is symptomatic.  He reports the following symptoms:  palpitations. Arrhythmia, symptoms and history reviewed with Dr. Phineas Inches.   Plan:  continue diltiazem '30mg'$  Q6H, Follow up with EP     Beatrix Fetters, RN  03/20/2022 1:33 PM

## 2022-04-06 ENCOUNTER — Institutional Professional Consult (permissible substitution): Payer: BC Managed Care – PPO | Admitting: Internal Medicine

## 2022-04-10 ENCOUNTER — Encounter: Payer: Self-pay | Admitting: Internal Medicine

## 2022-04-10 ENCOUNTER — Ambulatory Visit: Payer: BC Managed Care – PPO | Attending: Internal Medicine | Admitting: Internal Medicine

## 2022-04-10 VITALS — BP 106/78 | HR 99 | Ht 70.0 in | Wt 188.6 lb

## 2022-04-10 DIAGNOSIS — R002 Palpitations: Secondary | ICD-10-CM

## 2022-04-10 DIAGNOSIS — I471 Supraventricular tachycardia, unspecified: Secondary | ICD-10-CM | POA: Diagnosis not present

## 2022-04-10 NOTE — Patient Instructions (Addendum)
Medication Instructions:  Your physician recommends that you continue on your current medications as directed. Please refer to the Current Medication list given to you today.  *If you need a refill on your cardiac medications before your next appointment, please call your pharmacy*  Lab Work: None ordered.  If you have labs (blood work) drawn today and your tests are completely normal, you will receive your results only by: [place] (if you have MyChart) OR A paper copy in the mail If you have any lab test that is abnormal or we need to change your treatment, we will call you to review the results.  Testing/Procedures: None ordered.  Follow-Up:   Below are dates Dr. Lovena Le is available for an SVT Ablation: ( + ) [place], No Anesthesia   January:  31 February: 5, 12,14, 19, 26  Please call us at [phone], and let us know what date works best for you.    Cardiac Ablation Cardiac ablation is a procedure to destroy, or ablate, a small amount of heart tissue that is causing problems. The heart has many electrical connections. Sometimes, these connections are abnormal and can cause the heart to beat very fast or irregularly. Ablating the abnormal areas can improve the heart's rhythm or return it to normal. Ablation may be done for people who: Have irregular or rapid heartbeats (arrhythmias). Have Wolff-Parkinson-White syndrome. Have taken medicines for an arrhythmia that did not work or caused side effects. Have a high-risk heartbeat that may be life-threatening. Tell a health care provider about: Any allergies you have. All medicines you are taking, including vitamins, herbs, eye drops, creams, and over-the-counter medicines. Any problems you or family members have had with anesthesia. Any bleeding problems you have. Any surgeries you have had. Any medical conditions you have. Whether you are pregnant or may be pregnant. What are the risks? Your health care provider will  talk with you about risks. These may include: Infection. Bruising and bleeding. Stroke or blood clots. Damage to nearby structures or organs. Allergic reaction to medicines or dyes. Needing a pacemaker if the heart gets damaged. A pacemaker is a device that helps the heart beat normally. Failure of the procedure. A repeat procedure may be needed. What happens before the procedure? Medicines Ask your health care provider about: Changing or stopping your regular medicines. These include any heart rhythm medicines, diabetes medicines, or blood thinners you take. Taking medicines such as aspirin and ibuprofen. These medicines can thin your blood. Do not take them unless your health care provider tells you to. Taking over-the-counter medicines, vitamins, herbs, and supplements. General instructions Follow instructions from your health care provider about what you may eat and drink. If you will be going home right after the procedure, plan to have a responsible adult: Take you home from the hospital or clinic. You will not be allowed to drive. Care for you for the time you are told. Ask your health care provider what steps will be taken to prevent infection. What happens during the procedure?  An IV will be inserted into one of your veins. You may be given: A sedative. This helps you relax. Anesthesia. This will: Numb certain areas of your body. An incision will be made in your neck or your groin. A needle will be inserted through the incision and into a large vein in your neck or groin. The small, thin tube (catheter) will be inserted through the needle and moved to your heart. A type of X-ray (fluoroscopy) will be  used to help guide the catheter and provide images of the heart on a monitor. Dye may be injected through the catheter to help your surgeon see the area of the heart that needs treatment. Electrical currents will be sent from the catheter to destroy heart tissue in certain  areas. There are three types of energy that may be used to do this: Heat (radiofrequency energy). Laser energy. Extreme cold (cryoablation). When the tissue has been destroyed, the catheter will be removed. Pressure will be held on the insertion area to prevent bleeding. A bandage (dressing) will be placed over the insertion area. The procedure may vary among health care providers and hospitals. What happens after the procedure? Your blood pressure, heart rate and rhythm, breathing rate, and blood oxygen level will be monitored until you leave the hospital or clinic. Your insertion area will be checked for bleeding. You will need to lie still for a few hours. If your groin was used, you will need to keep your leg straight for a few hours after the catheter is removed. This information is not intended to replace advice given to you by your health care provider. Make sure you discuss any questions you have with your health care provider. Document Revised: 09/26/2021 Document Reviewed: 09/26/2021 Elsevier Patient Education  [employer].

## 2022-04-10 NOTE — Progress Notes (Signed)
        HPI Melvin Fisher is referred by Dr. Mary Branch for evaluation of SVT. He is a pleasant 66 yo man with dyslipidemia who has a h/o SVT dating back to young adulthood. He never sought medical attention and was always able to stop his symptoms with vagal maneuvers. He was found to have SVT at 177/min. He has been given a script for cardizem.  No Known Allergies           Current Outpatient Medications  Medication Sig Dispense Refill   aspirin 81 MG chewable tablet Chew 81 mg by mouth daily.       Carboxymethylcellulose Sodium (EYE DROPS OP) Place 1 drop into both eyes 2 (two) times daily as needed (dry eye).       clobetasol cream (TEMOVATE) 0.05 % Apply 1 Application topically daily as needed (rash).       diltiazem (CARDIZEM) 30 MG tablet Take 30 mg every 6 hours as needed for palpitations 90 tablet 3   finasteride (PROSCAR) 5 MG tablet Take 5 mg by mouth daily.       ibuprofen (ADVIL) 200 MG tablet Take 400 mg by mouth daily as needed for mild pain.       lisinopril (ZESTRIL) 10 MG tablet Take 10 mg by mouth daily.       meloxicam (MOBIC) 15 MG tablet Take 15 mg by mouth daily as needed for pain.       pravastatin (PRAVACHOL) 20 MG tablet Take 20 mg by mouth every other day.       tamsulosin (FLOMAX) 0.4 MG CAPS capsule Take 1 capsule (0.4 mg total) by mouth daily after supper. (Patient taking differently: Take 0.4 mg by mouth daily.) 30 capsule 5   lisinopril (PRINIVIL,ZESTRIL) 20 MG tablet Take 10 mg by mouth daily. (Patient not taking: Reported on 04/10/2022)       sulfamethoxazole-trimethoprim (BACTRIM DS) 800-160 MG tablet          No current facility-administered medications for this visit.            Past Medical History:  Diagnosis Date   Anxiety     BPH (benign prostatic hyperplasia)     Diverticulosis     Eczema     Hemangioma of liver     Hepatitis C     Hypertension     Palpitations     Prostate cancer (HCC)        ROS:    All systems reviewed and  negative except as noted in the HPI.          Past Surgical History:  Procedure Laterality Date   CARPAL TUNNEL RELEASE       PROSTATE BIOPSY       ROTATOR CUFF REPAIR                 Family History  Problem Relation Age of Onset   Hypertension Mother     Cancer - Lung Father     Hypertension Sister     Hypertension Sister     Cancer - Lung Sister     Diabetes Mellitus II Maternal Grandmother     Breast cancer Maternal Grandmother     Neuropathy Maternal Uncle     Prostate cancer Maternal Uncle     Colon cancer Neg Hx     Pancreatic cancer Neg Hx          Social History           Socioeconomic History   Marital status: Married      Spouse name: Not on file   Number of children: 2   Years of education: Not on file   Highest education level: High school graduate  Occupational History   Not on file  Tobacco Use   Smoking status: Never   Smokeless tobacco: Never  Vaping Use   Vaping Use: Never used  Substance and Sexual Activity   Alcohol use: Yes      Comment: occasionally   Drug use: No   Sexual activity: Yes  Other Topics Concern   Not on file  Social History Narrative    Lives at home with wife & daughter    Left handed    Caffeine: none    Married to Monica Walkowski. Patient's son, [name], resides in [place]. Patient's daughter, Lauren, lives with him.    Social Determinants of Health    Financial Resource Strain: Not on file  Food Insecurity: Not on file  Transportation Needs: Not on file  Physical Activity: Not on file  Stress: Not on file  Social Connections: Not on file  Intimate Partner Violence: Not on file        BP 106/78   Pulse 99   Ht 5' 10" (1.778 m)   Wt 188 lb 9.6 oz (85.5 kg)   SpO2 99%   BMI 27.06 kg/m    Physical Exam:   Well appearing NAD HEENT: Unremarkable Neck:  No JVD, no thyromegally Lymphatics:  No adenopathy Back:  No CVA tenderness Lungs:  Clear with no wheezes HEART:  Regular rate rhythm, no murmurs,  no rubs, no clicks Abd:  soft, positive bowel sounds, no organomegally, no rebound, no guarding Ext:  2 plus pulses, no edema, no cyanosis, no clubbing Skin:  No rashes no nodules Neuro:  CN II through XII intact, motor grossly intact   EKG - nsr   Assess/Plan: SVT - I have discussed the treatment options with the patient and the risks/benefits/goals/expectations of EP study and catheter ablation and he will call us if he wishes to proceed. Mehak Roskelley,MD 

## 2022-04-12 DIAGNOSIS — Z79899 Other long term (current) drug therapy: Secondary | ICD-10-CM | POA: Diagnosis not present

## 2022-04-12 DIAGNOSIS — Z23 Encounter for immunization: Secondary | ICD-10-CM | POA: Diagnosis not present

## 2022-04-12 DIAGNOSIS — E1121 Type 2 diabetes mellitus with diabetic nephropathy: Secondary | ICD-10-CM | POA: Diagnosis not present

## 2022-04-12 DIAGNOSIS — E7800 Pure hypercholesterolemia, unspecified: Secondary | ICD-10-CM | POA: Diagnosis not present

## 2022-04-12 DIAGNOSIS — Z0000 Encounter for general adult medical examination without abnormal findings: Secondary | ICD-10-CM | POA: Diagnosis not present

## 2022-05-07 DIAGNOSIS — C61 Malignant neoplasm of prostate: Secondary | ICD-10-CM | POA: Diagnosis not present

## 2022-05-09 ENCOUNTER — Telehealth: Payer: Self-pay | Admitting: Internal Medicine

## 2022-05-09 ENCOUNTER — Other Ambulatory Visit: Payer: Self-pay | Admitting: Sports Medicine

## 2022-05-09 ENCOUNTER — Ambulatory Visit
Admission: RE | Admit: 2022-05-09 | Discharge: 2022-05-09 | Disposition: A | Discharge status: Home / Self Care | Payer: BC Managed Care – PPO | Source: Ambulatory Visit | Attending: Sports Medicine | Admitting: Sports Medicine

## 2022-05-09 DIAGNOSIS — I471 Supraventricular tachycardia, unspecified: Secondary | ICD-10-CM

## 2022-05-09 DIAGNOSIS — M25532 Pain in left wrist: Secondary | ICD-10-CM | POA: Diagnosis not present

## 2022-05-09 DIAGNOSIS — M19032 Primary osteoarthritis, left wrist: Secondary | ICD-10-CM | POA: Diagnosis not present

## 2022-05-09 NOTE — Telephone Encounter (Signed)
New Message:    Patient says he is ready to schedule his Ablation please.

## 2022-05-10 NOTE — Telephone Encounter (Signed)
Work up is complete for SVT Ablation...  Pt is aware that Instruction letter will be sent via [facility] and knows how to find it.

## 2022-05-21 DIAGNOSIS — Z8546 Personal history of malignant neoplasm of prostate: Secondary | ICD-10-CM | POA: Diagnosis not present

## 2022-05-21 DIAGNOSIS — R350 Frequency of micturition: Secondary | ICD-10-CM | POA: Diagnosis not present

## 2022-05-21 DIAGNOSIS — R351 Nocturia: Secondary | ICD-10-CM | POA: Diagnosis not present

## 2022-05-21 DIAGNOSIS — N401 Enlarged prostate with lower urinary tract symptoms: Secondary | ICD-10-CM | POA: Diagnosis not present

## 2022-06-04 ENCOUNTER — Ambulatory Visit: Payer: BC Managed Care – PPO | Attending: Internal Medicine

## 2022-06-04 DIAGNOSIS — I471 Supraventricular tachycardia, unspecified: Secondary | ICD-10-CM

## 2022-06-04 LAB — CBC
Hematocrit: 43.9 % (ref 37.5–51.0)
Hemoglobin: 14.9 g/dL (ref 13.0–17.7)
MCH: 29.2 pg (ref 26.6–33.0)
MCHC: 33.9 g/dL (ref 31.5–35.7)
MCV: 86 fL (ref 79–97)
Platelets: 157 10*3/uL (ref 150–450)
RBC: 5.1 x10E6/uL (ref 4.14–5.80)
RDW: 14.1 % (ref 11.6–15.4)
WBC: 6.4 10*3/uL (ref 3.4–10.8)

## 2022-06-04 LAB — BASIC METABOLIC PANEL
BUN/Creatinine Ratio: 17 (ref 10–24)
BUN: 23 mg/dL (ref 8–27)
CO2: 26 mmol/L (ref 20–29)
Calcium: 9.7 mg/dL (ref 8.6–10.2)
Chloride: 105 mmol/L (ref 96–106)
Creatinine, Ser: 1.32 mg/dL — ABNORMAL HIGH (ref 0.76–1.27)
Glucose: 114 mg/dL — ABNORMAL HIGH (ref 70–99)
Potassium: 5.5 mmol/L — ABNORMAL HIGH (ref 3.5–5.2)
Sodium: 139 mmol/L (ref 134–144)
eGFR: 59 mL/min/{1.73_m2} — ABNORMAL LOW (ref >59)

## 2022-06-15 NOTE — Pre-Procedure Instructions (Signed)
Instructed patient on the following items: Arrival time 0730 Nothing to eat or drink after midnight No meds AM of procedure Responsible person to drive you home and stay with you for 24 hrs

## 2022-06-18 ENCOUNTER — Other Ambulatory Visit: Payer: Self-pay

## 2022-06-18 ENCOUNTER — Ambulatory Visit (HOSPITAL_COMMUNITY)
Admission: RE | Admit: 2022-06-18 | Discharge: 2022-06-18 | Disposition: A | Discharge status: Home / Self Care | Payer: BC Managed Care – PPO | Attending: Internal Medicine | Admitting: Internal Medicine

## 2022-06-18 ENCOUNTER — Encounter (HOSPITAL_COMMUNITY)
Admission: RE | Disposition: A | Discharge status: Home / Self Care | Payer: Self-pay | Source: Home / Self Care | Attending: Internal Medicine

## 2022-06-18 DIAGNOSIS — E785 Hyperlipidemia, unspecified: Secondary | ICD-10-CM | POA: Diagnosis not present

## 2022-06-18 DIAGNOSIS — I4719 Other supraventricular tachycardia: Secondary | ICD-10-CM | POA: Diagnosis not present

## 2022-06-18 DIAGNOSIS — Z8249 Family history of ischemic heart disease and other diseases of the circulatory system: Secondary | ICD-10-CM | POA: Diagnosis not present

## 2022-06-18 DIAGNOSIS — I471 Supraventricular tachycardia, unspecified: Secondary | ICD-10-CM

## 2022-06-18 HISTORY — PX: SVT ABLATION: EP1225

## 2022-06-18 SURGERY — SVT ABLATION

## 2022-06-18 MED ORDER — BUPIVACAINE HCL (PF) 0.25 % IJ SOLN
INTRAMUSCULAR | Status: DC | PRN
  Administered 2022-06-18: 30 mL

## 2022-06-18 MED ORDER — FENTANYL CITRATE (PF) 100 MCG/2ML IJ SOLN
INTRAMUSCULAR | Status: DC | PRN
  Administered 2022-06-18: 25 ug via INTRAVENOUS
  Administered 2022-06-18: 12.5 ug via INTRAVENOUS

## 2022-06-18 MED ORDER — HEPARIN (PORCINE) IN NACL 1000-0.9 UT/500ML-% IV SOLN
INTRAVENOUS | Status: DC | PRN
  Administered 2022-06-18: 500 mL

## 2022-06-18 MED ORDER — MIDAZOLAM HCL 5 MG/5ML IJ SOLN
INTRAMUSCULAR | Status: AC
  Filled 2022-06-18: qty 5

## 2022-06-18 MED ORDER — ONDANSETRON HCL 4 MG/2ML IJ SOLN: 4.0000 mg | Freq: Four times a day (QID) | INTRAMUSCULAR | Status: DC | PRN

## 2022-06-18 MED ORDER — SODIUM CHLORIDE 0.9 % IV SOLN: 250.0000 mL | INTRAVENOUS | Status: DC | PRN

## 2022-06-18 MED ORDER — ACETAMINOPHEN 325 MG PO TABS: 650.0000 mg | ORAL_TABLET | ORAL | Status: DC | PRN

## 2022-06-18 MED ORDER — SODIUM CHLORIDE 0.9% FLUSH: 3.0000 mL | INTRAVENOUS | Status: DC | PRN

## 2022-06-18 MED ORDER — SODIUM CHLORIDE 0.9 % IV SOLN: INTRAVENOUS | Status: DC

## 2022-06-18 MED ORDER — BUPIVACAINE HCL (PF) 0.25 % IJ SOLN
INTRAMUSCULAR | Status: AC
  Filled 2022-06-18: qty 30

## 2022-06-18 MED ORDER — FENTANYL CITRATE (PF) 100 MCG/2ML IJ SOLN
INTRAMUSCULAR | Status: AC
  Filled 2022-06-18: qty 2

## 2022-06-18 MED ORDER — SODIUM CHLORIDE 0.9% FLUSH: 3.0000 mL | Freq: Two times a day (BID) | INTRAVENOUS | Status: DC

## 2022-06-18 MED ORDER — MIDAZOLAM HCL 5 MG/5ML IJ SOLN
INTRAMUSCULAR | Status: DC | PRN
  Administered 2022-06-18: 1 mg via INTRAVENOUS
  Administered 2022-06-18: 2 mg via INTRAVENOUS

## 2022-06-18 SURGICAL SUPPLY — 11 items
BAG SNAP BAND KOVER 36X36 (MISCELLANEOUS) IMPLANT
CATH EZ STEER NAV 4MM D-F CUR (ABLATOR) IMPLANT
CATH JOSEPH QUAD ALLRED 6F REP (CATHETERS) IMPLANT
CATH WEB BI DIR CSDF CRV REPRO (CATHETERS) IMPLANT
PACK EP LATEX FREE (CUSTOM PROCEDURE TRAY) ×1
PACK EP LF (CUSTOM PROCEDURE TRAY) ×1 IMPLANT
PAD DEFIB RADIO PHYSIO CONN (PAD) ×1 IMPLANT
PATCH CARTO3 (PAD) IMPLANT
SHEATH PINNACLE 6F 10CM (SHEATH) IMPLANT
SHEATH PINNACLE 7F 10CM (SHEATH) IMPLANT
SHEATH PINNACLE 8F 10CM (SHEATH) IMPLANT

## 2022-06-18 NOTE — Progress Notes (Signed)
Right groin  3 venous sheaths pulled (31f, 790f [id number] pull site level 0 Manual pressure held for 20 minutes Post sheath pull site level 0 Dressed with gauze and tegaderm Instructions reviewed with patient.  2+ right DP post sheath pull Bed rest begins @ 1200 x 6 hrs

## 2022-06-18 NOTE — H&P (Signed)
HPI Melvin Fisher is referred by Dr. Phineas Inches for evaluation of SVT. He is a pleasant 67 yo man with dyslipidemia who has a h/o SVT dating back to young adulthood. He never sought medical attention and was always able to stop his symptoms with vagal maneuvers. He was found to have SVT at 177/min. He has been given a script for cardizem.  No Known Allergies           Current Outpatient Medications  Medication Sig Dispense Refill   aspirin 81 MG chewable tablet Chew 81 mg by mouth daily.       Carboxymethylcellulose Sodium (EYE DROPS OP) Place 1 drop into both eyes 2 (two) times daily as needed (dry eye).       clobetasol cream (TEMOVATE) AB-123456789 % Apply 1 Application topically daily as needed (rash).       diltiazem (CARDIZEM) 30 MG tablet Take 30 mg every 6 hours as needed for palpitations 90 tablet 3   finasteride (PROSCAR) 5 MG tablet Take 5 mg by mouth daily.       ibuprofen (ADVIL) 200 MG tablet Take 400 mg by mouth daily as needed for mild pain.       lisinopril (ZESTRIL) 10 MG tablet Take 10 mg by mouth daily.       meloxicam (MOBIC) 15 MG tablet Take 15 mg by mouth daily as needed for pain.       pravastatin (PRAVACHOL) 20 MG tablet Take 20 mg by mouth every other day.       tamsulosin (FLOMAX) 0.4 MG CAPS capsule Take 1 capsule (0.4 mg total) by mouth daily after supper. (Patient taking differently: Take 0.4 mg by mouth daily.) 30 capsule 5   lisinopril (PRINIVIL,ZESTRIL) 20 MG tablet Take 10 mg by mouth daily. (Patient not taking: Reported on 04/10/2022)       sulfamethoxazole-trimethoprim (BACTRIM DS) 800-160 MG tablet          No current facility-administered medications for this visit.            Past Medical History:  Diagnosis Date   Anxiety     BPH (benign prostatic hyperplasia)     Diverticulosis     Eczema     Hemangioma of liver     Hepatitis C     Hypertension     Palpitations     Prostate cancer ([place])        ROS:    All systems reviewed and  negative except as noted in the HPI.          Past Surgical History:  Procedure Laterality Date   CARPAL TUNNEL RELEASE       PROSTATE BIOPSY       ROTATOR CUFF REPAIR                 Family History  Problem Relation Age of Onset   Hypertension Mother     Cancer - Lung Father     Hypertension Sister     Hypertension Sister     Cancer - Lung Sister     Diabetes Mellitus II Maternal Grandmother     Breast cancer Maternal Grandmother     Neuropathy Maternal Uncle     Prostate cancer Maternal Uncle     Colon cancer Neg Hx     Pancreatic cancer Neg Hx          Social History  Socioeconomic History   Marital status: Married      Spouse name: Not on file   Number of children: 2   Years of education: Not on file   Highest education level: High school graduate  Occupational History   Not on file  Tobacco Use   Smoking status: Never   Smokeless tobacco: Never  Vaping Use   Vaping Use: Never used  Substance and Sexual Activity   Alcohol use: Yes      Comment: occasionally   Drug use: No   Sexual activity: Yes  Other Topics Concern   Not on file  Social History Narrative    Lives at home with wife & daughter    Left handed    Caffeine: none    Married to [name]. Patient's son, Aida Raider, resides in [place]. Patient's daughter, [name], lives with him.    Social Determinants of Health    Financial Resource Strain: Not on file  Food Insecurity: Not on file  Transportation Needs: Not on file  Physical Activity: Not on file  Stress: Not on file  Social Connections: Not on file  Intimate Partner Violence: Not on file        BP 106/78   Pulse 99   Ht '5\' 10"'$  (1.778 m)   Wt 188 lb 9.6 oz (85.5 kg)   SpO2 99%   BMI 27.06 kg/m    Physical Exam:   Well appearing NAD HEENT: Unremarkable Neck:  No JVD, no thyromegally Lymphatics:  No adenopathy Back:  No CVA tenderness Lungs:  Clear with no wheezes HEART:  Regular rate rhythm, no murmurs,  no rubs, no clicks Abd:  soft, positive bowel sounds, no organomegally, no rebound, no guarding Ext:  2 plus pulses, no edema, no cyanosis, no clubbing Skin:  No rashes no nodules Neuro:  CN II through XII intact, motor grossly intact   EKG - nsr   Assess/Plan: SVT - I have discussed the treatment options with the patient and the risks/benefits/goals/expectations of EP study and catheter ablation and he will call us if he wishes to proceed. Carleene Overlie Prudence Heiny,MD

## 2022-06-18 NOTE — Discharge Instructions (Signed)
Femoral Site Care This sheet gives you information about how to care for yourself after your procedure. Your health care provider may also give you more specific instructions. If you have problems or questions, contact your health care provider. What can I expect after the procedure?  After the procedure, it is common to have: Bruising that usually fades within 1-2 weeks. Tenderness at the site. Follow these instructions at home: Wound care Follow instructions from your health care provider about how to take care of your insertion site. Make sure you: Wash your hands with soap and water before you change your bandage (dressing). If soap and water are not available, use hand sanitizer. Remove your dressing as told by your health care provider. In 24 hours Do not take baths, swim, or use a hot tub until your health care provider approves. You may shower 24-48 hours after the procedure or as told by your health care provider. Gently wash the site with plain soap and water. Pat the area dry with a clean towel. Do not rub the site. This may cause bleeding. Do not apply powder or lotion to the site. Keep the site clean and dry. Check your femoral site every day for signs of infection. Check for: Redness, swelling, or pain. Fluid or blood. Warmth. Pus or a bad smell. Activity For the first 2-3 days after your procedure, or as long as directed: Avoid climbing stairs as much as possible. Do not squat. Do not lift anything that is heavier than 10 lb (4.5 kg), or the limit that you are told, until your health care provider says that it is safe. For 5 days Rest as directed. Avoid sitting for a long time without moving. Get up to take short walks every 1-2 hours. Do not drive for 24 hours if you were given a medicine to help you relax (sedative). General instructions Take over-the-counter and prescription medicines only as told by your health care provider. Keep all follow-up visits as told by  your health care provider. This is important. Contact a health care provider if you have: A fever or chills. You have redness, swelling, or pain around your insertion site. Get help right away if: The catheter insertion area swells very fast. You pass out. You suddenly start to sweat or your skin gets clammy. The catheter insertion area is bleeding, and the bleeding does not stop when you hold steady pressure on the area. The area near or just beyond the catheter insertion site becomes pale, cool, tingly, or numb. These symptoms may represent a serious problem that is an emergency. Do not wait to see if the symptoms will go away. Get medical help right away. Call your local emergency services (911 in the [place] Do not drive yourself to the hospital. Summary After the procedure, it is common to have bruising that usually fades within 1-2 weeks. Check your femoral site every day for signs of infection. Do not lift anything that is heavier than 10 lb (4.5 kg), or the limit that you are told, until your health care provider says that it is safe. This information is not intended to replace advice given to you by your health care provider. Make sure you discuss any questions you have with your health care provider. Document Revised: 04/22/2017 Document Reviewed: 04/22/2017 Elsevier Patient Education  2020 [employer].

## 2022-06-19 ENCOUNTER — Encounter (HOSPITAL_COMMUNITY): Payer: Self-pay | Admitting: Internal Medicine

## 2022-07-24 ENCOUNTER — Ambulatory Visit: Payer: BC Managed Care – PPO | Attending: Internal Medicine | Admitting: Internal Medicine

## 2022-07-24 ENCOUNTER — Encounter: Payer: Self-pay | Admitting: Internal Medicine

## 2022-07-24 VITALS — BP 122/84 | HR 80 | Ht 70.0 in | Wt 192.8 lb

## 2022-07-24 DIAGNOSIS — I471 Supraventricular tachycardia, unspecified: Secondary | ICD-10-CM

## 2022-07-24 DIAGNOSIS — I10 Essential (primary) hypertension: Secondary | ICD-10-CM | POA: Diagnosis not present

## 2022-07-24 DIAGNOSIS — R002 Palpitations: Secondary | ICD-10-CM

## 2022-07-24 NOTE — Progress Notes (Signed)
HPI Mr. Corazza returns today for ongoing evaluation of SVT. He is a pleasant 67 yo man with dyslipidemia who has a h/o SVT dating back to young adulthood. He never sought medical attention and was always able to stop his symptoms with vagal maneuvers. He was found to have SVT at 177/min. 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   Allergies  Allergen Reactions   Simvastatin Other (See Comments)     Current Outpatient Medications  Medication Sig Dispense Refill   aspirin 81 MG chewable tablet Chew 81 mg by mouth daily.     Carboxymethylcellulose Sodium (EYE DROPS OP) Place 1 drop into both eyes 2 (two) times daily as needed (dry eye).     clobetasol cream (TEMOVATE) [id number] % Apply 1 Application topically daily as needed (rash).     diclofenac Sodium (VOLTAREN) 1 % GEL Apply 1 Application topically 2 (two) times daily as needed (wrist pain).     diltiazem (CARDIZEM) 30 MG tablet Take 30 mg every 6 hours as needed for palpitations 90 tablet 3   lisinopril (ZESTRIL) 10 MG tablet Take 10 mg by mouth daily.     meloxicam (MOBIC) 15 MG tablet Take 15 mg by mouth daily as needed for pain.     pravastatin (PRAVACHOL) 20 MG tablet Take 20 mg by mouth every other day.     tamsulosin (FLOMAX) 0.4 MG CAPS capsule Take 1 capsule (0.4 mg total) by mouth daily after supper. (Patient taking differently: Take 0.4 mg by mouth daily.) 30 capsule 5   No current facility-administered medications for this visit.     Past Medical History:  Diagnosis Date   Anxiety    BPH (benign prostatic hyperplasia)    Diverticulosis    Eczema    Hemangioma of liver    Hepatitis C    Hypertension    Palpitations    Prostate cancer     ROS:   All systems reviewed and negative except as noted in the HPI.   Past Surgical History:  Procedure Laterality Date   CARPAL TUNNEL RELEASE     PROSTATE BIOPSY     ROTATOR CUFF REPAIR     SVT  ABLATION N/A 06/18/2022   Procedure: SVT ABLATION;  Surgeon: Evans Lance, MD;  Location: [facility];  Service: Cardiovascular;  Laterality: N/A;     Family History  Problem Relation Age of Onset   Hypertension Mother    Cancer - Lung Father    Hypertension Sister    Hypertension Sister    Cancer - Lung Sister    Diabetes Mellitus II Maternal Grandmother    Breast cancer Maternal Grandmother    Neuropathy Maternal Uncle    Prostate cancer Maternal Uncle    Colon cancer Neg Hx    Pancreatic cancer Neg Hx      Social History   Socioeconomic History   Marital status: Married    Spouse name: Not on file   Number of children: 2   Years of education: Not on file   Highest education level: High school graduate  Occupational History   Not on file  Tobacco Use   Smoking status: Never   Smokeless tobacco: Never  Vaping Use   Vaping Use: Never used  Substance and Sexual Activity   Alcohol use: Yes    Comment: occasionally   Drug use: No   Sexual activity: Yes  Other Topics  Concern   Not on file  Social History Narrative   Lives at home with wife & daughter   Left handed   Caffeine: none   Married to [name]. Patient's son, Aida Raider, resides in [place]. Patient's daughter, [name], lives with him.   Social Determinants of Health   Financial Resource Strain: Not on file  Food Insecurity: Not on file  Transportation Needs: Not on file  Physical Activity: Not on file  Stress: Not on file  Social Connections: Not on file  Intimate Partner Violence: Not on file     BP 122/84   Pulse 80   Ht 5\' 10"  (1.778 m)   Wt 192 lb 12.8 oz (87.5 kg)   SpO2 98%   BMI 27.66 kg/m   Physical Exam:  Well appearing NAD HEENT: Unremarkable Neck:  No JVD, no thyromegally Lymphatics:  No adenopathy Back:  No CVA tenderness Lungs:  Clear with no wheezes HEART:  Regular rate rhythm, no murmurs, no rubs, no clicks Abd:  soft, positive bowel sounds, no  organomegally, no rebound, no guarding Ext:  2 plus pulses, no edema, no cyanosis, no clubbing Skin:  No rashes no nodules Neuro:  CN II through XII intact, motor grossly intact  EKG - nsr  Assess/Plan:  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 He will undergo watchful waiting.  Carleene Overlie Mattison Stuckey,MD

## 2022-07-24 NOTE — Patient Instructions (Addendum)
Medication Instructions:  Your physician recommends that you continue on your current medications as directed. Please refer to the Current Medication list given to you today.  *If you need a refill on your cardiac medications before your next appointment, please call your pharmacy*  Lab Work: None ordered.  If you have labs (blood work) drawn today and your tests are completely normal, you will receive your results only by: MyChart Message (if you have MyChart) OR A paper copy in the mail If you have any lab test that is abnormal or we need to change your treatment, we will call you to review the results.  Testing/Procedures: None ordered.  Follow-Up: At [facility], you and your health needs are our priority.  As part of our continuing mission to provide you with exceptional heart care, we have created designated Provider Care Teams.  These Care Teams include your primary Cardiologist (physician) and Advanced Practice Providers (APPs -  Physician Assistants and Nurse Practitioners) who all work together to provide you with the care you need, when you need it.  We recommend signing up for the patient portal called "MyChart".  Sign up information is provided on this After Visit Summary.  MyChart is used to connect with patients for Virtual Visits (Telemedicine).  Patients are able to view lab/test results, encounter notes, upcoming appointments, etc.  Non-urgent messages can be sent to your provider as well.   To learn more about what you can do with MyChart, go to [email or web address].    Your next appointment:   AS NEEDED  The format for your next appointment:   In Person  Provider:   Gregg Taylor, MD{or one of the following Advanced Practice Providers on your designated Care Team:   Renee Ursuy, PA-C Michael "Andy" Tillery, PA-C          

## 2022-11-12 DIAGNOSIS — C61 Malignant neoplasm of prostate: Secondary | ICD-10-CM | POA: Diagnosis not present

## 2022-11-19 DIAGNOSIS — R350 Frequency of micturition: Secondary | ICD-10-CM | POA: Diagnosis not present

## 2022-11-19 DIAGNOSIS — N401 Enlarged prostate with lower urinary tract symptoms: Secondary | ICD-10-CM | POA: Diagnosis not present

## 2022-11-19 DIAGNOSIS — Z8546 Personal history of malignant neoplasm of prostate: Secondary | ICD-10-CM | POA: Diagnosis not present

## 2022-11-19 DIAGNOSIS — R351 Nocturia: Secondary | ICD-10-CM | POA: Diagnosis not present

## 2023-02-18 DIAGNOSIS — Z8546 Personal history of malignant neoplasm of prostate: Secondary | ICD-10-CM | POA: Diagnosis not present

## 2023-07-02 NOTE — Progress Notes (Unsigned)
 Cardiology Office Note:  .   Date:  07/03/2023  ID:  Melvin Fisher, DOB 03/30/1956, MRN [id number] PCP: Mila Palmer, MD  [facility] Providers Cardiologist:  Maisie Fus, MD {  History of Present Illness: .   Melvin Fisher is a 68 y.o. male SVT status post ablation 05/2022, hypertension, hyperlipidemia.  2018 was followed by Dr. Allyson Sabal, had some shortness of breath and atypical epigastric pain.  Echocardiogram unremarkable, ETT normal.  Lost to follow-up, saw Dr. Wyline Mood November 2023 for chronic SVT since childhood.  Started on beta-blocker and 7-day heart monitor showing symptomatic episodes of SVT with the longest being about 1 hour.  Referred to Dr. 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 He saw Dr. Ladona Ridgel at follow-up 07/2022 after the fact and with no recurrent symptoms.  Today at follow-up he has had no recurrent symptoms.  Feels very good with no complaints whatsoever.  Works as a [employer].  Compliant with all of his medications, not requiring his as needed diltiazem. Very healthy, active individual.   ROS: Denies: Chest pain, shortness of breath, orthopnea, peripheral edema, palpitations, decreased exercise intolerance,    Studies Reviewed: Marland Kitchen   EKG Interpretation Date/Time:  Wednesday July 03 2023 08:03:09 EDT Ventricular Rate:  77 PR Interval:  190 QRS Duration:  78 QT Interval:  400 QTC Calculation: 452 R Axis:   -19  Text Interpretation: Normal sinus rhythm Normal ECG When compared with ECG of 22-Feb-2022 14:09, PREVIOUS ECG IS PRESENT Confirmed by Yvonna Alanis [phone]) on 07/03/2023 8:08:29 AM    Cardiac Studies & Procedures   ______________________________________________________________________________________________   STRESS TESTS  EXERCISE TOLERANCE TEST (ETT) 10/10/2016  Narrative  Pt walked for 11:09 of a Bruce protocol GXt. Peak HR of 155 which is 97% MPHR. There  were no ST or T wave changes to suggest ischemia.  BP response was normal  Negative GXT   ECHOCARDIOGRAM  ECHOCARDIOGRAM COMPLETE 09/27/2016  Narrative *[facility]* [address] [place], [place] [address] [phone]  ------------------------------------------------------------------- Transthoracic Echocardiography  Patient:    Melvin Fisher, Melvin Fisher MR #:       [id number] Study Date: 09/27/2016 Gender:     M Age:        60 Height:     177.8 cm Weight:     91.2 kg BSA:        2.14 m^2 Pt. Status: Room:  ATTENDING    Armanda Magic, MD ORDERING     Nanetta Batty, MD REFERRING    Nanetta Batty, MD PERFORMING   [facility], Outpatient SONOGRAPHER  [facility], RDCS  cc:  ------------------------------------------------------------------- LV EF: 50% -   55%  ------------------------------------------------------------------- Indications:      Chest Pain (R07.9).  ------------------------------------------------------------------- History:   PMH:  Palpitations, Hepatitis C.  Dyspnea.  Risk factors:  Hypertension. Dyslipidemia.  ------------------------------------------------------------------- Study Conclusions  - Left ventricle: The cavity size was normal. Systolic function was normal. The estimated ejection fraction was in the range of 50% to 55%. Wall motion was normal; there were no regional wall motion abnormalities. Features are consistent with a pseudonormal left ventricular filling pattern, with concomitant abnormal relaxation and increased filling pressure (grade 2 diastolic dysfunction). - Aortic valve: Trileaflet; moderately thickened, moderately calcified leaflets. - Mitral valve: There was mild regurgitation. - Left atrium: The atrium was mildly dilated. - Tricuspid valve: There was mild regurgitation.  ------------------------------------------------------------------- Study data:  No prior study was available for comparison.  Study status:  Routine.  Procedure:  Transthoracic echocardiography. Image quality was adequate.          Transthoracic echocardiography.  M-mode, complete 2D, spectral Doppler, and color Doppler.  Birthdate:  Patient birthdate: 08-Dec-1955.  Age:  Patient is 68 yr old.  Sex:  Gender: male.    BMI: 28.8 kg/m^2.  Blood pressure:     114/80  Patient status:  Outpatient.  Study date: Study date: 09/27/2016. Study time: 07:17 AM.  Location:  [facility]  -------------------------------------------------------------------  ------------------------------------------------------------------- Left ventricle:  The cavity size was normal. Systolic function was normal. The estimated ejection fraction was in the range of 50% to 55%. Wall motion was normal; there were no regional wall motion abnormalities. Features are consistent with a pseudonormal left ventricular filling pattern, with concomitant abnormal relaxation and increased filling pressure (grade 2 diastolic dysfunction).  ------------------------------------------------------------------- Aortic valve:   Trileaflet; moderately thickened, moderately calcified leaflets. Mobility was not restricted.  Doppler: Transvalvular velocity was within the normal range. There was no stenosis. There was no regurgitation.  ------------------------------------------------------------------- Aorta:  Aortic root: The aortic root was normal in size.  ------------------------------------------------------------------- Mitral valve:   Structurally normal valve.   Mobility was not restricted.  Doppler:  Transvalvular velocity was within the normal range. There was no evidence for stenosis. There was mild regurgitation.  ------------------------------------------------------------------- Left atrium:  The atrium was mildly dilated.  ------------------------------------------------------------------- Right ventricle:  The cavity size was normal.  Wall thickness was normal. Systolic function was normal.  ------------------------------------------------------------------- Pulmonic valve:    Structurally normal valve.   Cusp separation was normal.  Doppler:  Transvalvular velocity was within the normal range. There was no evidence for stenosis. There was trivial regurgitation.  ------------------------------------------------------------------- Tricuspid valve:   Structurally normal valve.    Doppler: Transvalvular velocity was within the normal range. There was mild regurgitation.  ------------------------------------------------------------------- Pulmonary artery:   The main pulmonary artery was normal-sized. Systolic pressure was within the normal range.  ------------------------------------------------------------------- Right atrium:  The atrium was normal in size.  ------------------------------------------------------------------- Pericardium:  There was no pericardial effusion.  ------------------------------------------------------------------- Systemic veins: Inferior vena cava: The vessel was normal in size.  ------------------------------------------------------------------- Measurements  Left ventricle                         Value        Reference LV ID, ED, PLAX chordal                46    mm     43 - 52 LV ID, ES, PLAX chordal                33.4  mm     23 - 38 LV fx shortening, PLAX chordal (L)     27    %      >=29 LV PW thickness, ED                    9.03  mm     ---------- IVS/LV PW ratio, ED                    0.96         <=1.3 Stroke volume, 2D                      64    ml     ---------- Stroke volume/bsa, 2D  30    ml/m^2 ---------- LV e&', lateral                         9.14  cm/s   ---------- LV E/e&', lateral                       7.69         ---------- LV e&', medial                          6.2   cm/s   ---------- LV E/e&', medial                        11.34         ---------- LV e&', average                         7.67  cm/s   ---------- LV E/e&', average                       9.17         ----------  Ventricular septum                     Value        Reference IVS thickness, ED                      8.68  mm     ----------  LVOT                                   Value        Reference LVOT ID, S                             24    mm     ---------- LVOT area                              4.52  cm^2   ---------- LVOT peak velocity, S                  59.1  cm/s   ---------- LVOT mean velocity, S                  37.5  cm/s   ---------- LVOT VTI, S                            14.1  cm     ----------  Aorta                                  Value        Reference Aortic root ID, ED                     35    mm     ---------- Ascending aorta ID, A-P, S             33    mm     ----------  Left atrium  Value        Reference LA ID, A-P, ES                         41    mm     ---------- LA ID/bsa, A-P                         1.91  cm/m^2 <=2.2 LA volume, S                           70.1  ml     ---------- LA volume/bsa, S                       32.7  ml/m^2 ---------- LA volume, ES, 1-p A4C                 73.2  ml     ---------- LA volume/bsa, ES, 1-p A4C             34.2  ml/m^2 ---------- LA volume, ES, 1-p A2C                 66.1  ml     ---------- LA volume/bsa, ES, 1-p A2C             30.9  ml/m^2 ----------  Mitral valve                           Value        Reference Mitral E-wave peak velocity            70.3  cm/s   ---------- Mitral A-wave peak velocity            52.9  cm/s   ---------- Mitral deceleration time       (H)     246   ms     150 - 230 Mitral E/A ratio, peak                 1.3          ----------  Pulmonary arteries                     Value        Reference PA pressure, S, DP                     26    mm Hg  <=30  Tricuspid valve                        Value        Reference Tricuspid regurg  peak velocity         240   cm/s   ---------- Tricuspid peak RV-RA gradient          23    mm Hg  ----------  Right atrium                           Value        Reference RA ID, S-I, ES, A4C            (H)     52.4  mm     34 - 49 RA area, ES, A4C  17.3  cm^2   8.3 - 19.5 RA volume, ES, A/L                     47.2  ml     ---------- RA volume/bsa, ES, A/L                 22    ml/m^2 ----------  Right ventricle                        Value        Reference TAPSE                                  22    mm     ---------- RV s&', lateral, S                      11.3  cm/s   ----------  Legend: (L)  and  (H)  mark values outside specified reference range.  ------------------------------------------------------------------- Prepared and Electronically Authenticated by  Armanda Magic, MD 2018-06-07T09:22:00    MONITORS  LONG TERM MONITOR (3-14 DAYS) 03/20/2022  Narrative Triggered events sinus rhythm and SVT. Longest run of SVT 1 hour and 5 minutes.  Patch Wear Time:  6 days and 23 hours (2023-11-06T21:20:49-0500 to 2023-11-13T20:40:25-0500)  Patient had a min HR of 48 bpm, max HR of 197 bpm, and avg HR of 75 bpm. Predominant underlying rhythm was Sinus Rhythm. 14 Supraventricular Tachycardia runs occurred, the run with the fastest interval lasting 1 min 13 secs with a max rate of 197 bpm, the longest lasting 1 hour 5 mins with an avg rate of 166 bpm. Supraventricular Tachycardia was detected within +/- 45 seconds of symptomatic patient event(s). Isolated SVEs were occasional (3.2%, 24026), SVE Couplets were rare (<1.0%, 13), and SVE Triplets were rare (<1.0%, 1). Isolated VEs were rare (<1.0%), VE Couplets were rare (<1.0%), and no VE Triplets were present. MD notification criteria for Supraventricular Tachycardia met - report posted prior to notification per account request (GU).        ______________________________________________________________________________________________      Risk Assessment/Calculations:             Physical Exam:   VS:  BP 116/78 (BP Location: Left Arm, Patient Position: Sitting, Cuff Size: Normal)   Pulse 77   Ht 5\' 10"  (1.778 m)   Wt 201 lb (91.2 kg)   SpO2 97%   BMI 28.84 kg/m    Wt Readings from Last 3 Encounters:  07/03/23 201 lb (91.2 kg)  07/24/22 192 lb 12.8 oz (87.5 kg)  06/18/22 195 lb (88.5 kg)    GEN: Well nourished, well developed in no acute distress NECK: No JVD; No carotid bruits CARDIAC: RRR, no murmurs, rubs, gallops RESPIRATORY:  Clear to auscultation without rales, wheezing or rhonchi  ABDOMEN: Soft, non-tender, non-distended EXTREMITIES:  No edema; No deformity   ASSESSMENT AND PLAN: .     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  Very stable. no recurrent symptoms.  Today is still in NSR, followed by Dr. Ladona Ridgel Has as needed diltiazem 30 mg, but never needs this.  Hypertension Very well-controlled continue with lisinopril 10 mg daily  Hyperlipidemia Has had myalgias on simvastatin.  Tolerating pravastatin well 20 mg.  PCPs checking routine labs and lipid panel.       Dispo: 1 year  Signed, Abagail Kitchens, PA-C

## 2023-07-03 ENCOUNTER — Ambulatory Visit: Payer: BC Managed Care – PPO | Attending: Physician Assistant | Admitting: Cardiology

## 2023-07-03 ENCOUNTER — Encounter: Payer: Self-pay | Admitting: Physician Assistant

## 2023-07-03 VITALS — BP 116/78 | HR 77 | Ht 70.0 in | Wt 201.0 lb

## 2023-07-03 DIAGNOSIS — I10 Essential (primary) hypertension: Secondary | ICD-10-CM | POA: Diagnosis not present

## 2023-07-03 DIAGNOSIS — E782 Mixed hyperlipidemia: Secondary | ICD-10-CM

## 2023-07-03 DIAGNOSIS — I471 Supraventricular tachycardia, unspecified: Secondary | ICD-10-CM

## 2023-07-03 NOTE — Patient Instructions (Signed)
 Medication Instructions:  NO CHANGES *If you need a refill on your cardiac medications before your next appointment, please call your pharmacy*   Lab Work: NO LABS If you have labs (blood work) drawn today and your tests are completely normal, you will receive your results only by: MyChart Message (if you have MyChart) OR A paper copy in the mail If you have any lab test that is abnormal or we need to change your treatment, we will call you to review the results.   Testing/Procedures: NO TESTING   Follow-Up: At [facility], you and your health needs are our priority.  As part of our continuing mission to provide you with exceptional heart care, we have created designated Provider Care Teams.  These Care Teams include your primary Cardiologist (physician) and Advanced Practice Providers (APPs -  Physician Assistants and Nurse Practitioners) who all work together to provide you with the care you need, when you need it.  Your next appointment:   1 year(s)  Provider:   Nanetta Batty, MD     Other Instructions
